# Patient Record
Sex: Female | Born: 1980 | Race: White | Hispanic: No | Marital: Married | Smoking: Former smoker
Health system: Southern US, Community
[De-identification: ages and names within clinical notes are randomized; demographics above are authoritative.]

## PROBLEM LIST (undated history)

## (undated) DIAGNOSIS — R55 Syncope and collapse: Secondary | ICD-10-CM

## (undated) DIAGNOSIS — R519 Headache, unspecified: Secondary | ICD-10-CM

## (undated) DIAGNOSIS — F419 Anxiety disorder, unspecified: Secondary | ICD-10-CM

## (undated) DIAGNOSIS — F329 Major depressive disorder, single episode, unspecified: Secondary | ICD-10-CM

## (undated) DIAGNOSIS — E785 Hyperlipidemia, unspecified: Secondary | ICD-10-CM

## (undated) DIAGNOSIS — R51 Headache: Secondary | ICD-10-CM

## (undated) DIAGNOSIS — K219 Gastro-esophageal reflux disease without esophagitis: Secondary | ICD-10-CM

## (undated) DIAGNOSIS — N809 Endometriosis, unspecified: Secondary | ICD-10-CM

## (undated) DIAGNOSIS — E119 Type 2 diabetes mellitus without complications: Secondary | ICD-10-CM

## (undated) DIAGNOSIS — F32A Depression, unspecified: Secondary | ICD-10-CM

## (undated) DIAGNOSIS — R569 Unspecified convulsions: Secondary | ICD-10-CM

## (undated) DIAGNOSIS — T7840XA Allergy, unspecified, initial encounter: Secondary | ICD-10-CM

## (undated) HISTORY — DX: Headache: R51

## (undated) HISTORY — DX: Endometriosis, unspecified: N80.9

## (undated) HISTORY — DX: Allergy, unspecified, initial encounter: T78.40XA

## (undated) HISTORY — DX: Type 2 diabetes mellitus without complications: E11.9

## (undated) HISTORY — DX: Major depressive disorder, single episode, unspecified: F32.9

## (undated) HISTORY — DX: Anxiety disorder, unspecified: F41.9

## (undated) HISTORY — DX: Gastro-esophageal reflux disease without esophagitis: K21.9

## (undated) HISTORY — PX: ABDOMINAL HYSTERECTOMY: SHX81

## (undated) HISTORY — DX: Depression, unspecified: F32.A

## (undated) HISTORY — DX: Syncope and collapse: R55

## (undated) HISTORY — DX: Headache, unspecified: R51.9

## (undated) HISTORY — PX: EXPLORATORY LAPAROTOMY: SUR591

## (undated) HISTORY — DX: Unspecified convulsions: R56.9

## (undated) HISTORY — DX: Hyperlipidemia, unspecified: E78.5

---

## 2004-10-15 ENCOUNTER — Emergency Department: Payer: Self-pay | Admitting: Internal Medicine

## 2005-01-30 ENCOUNTER — Observation Stay: Payer: Self-pay | Admitting: Unknown Physician Specialty

## 2005-02-11 ENCOUNTER — Ambulatory Visit: Payer: Self-pay | Admitting: Unknown Physician Specialty

## 2005-04-09 ENCOUNTER — Observation Stay: Payer: Self-pay | Admitting: Unknown Physician Specialty

## 2005-04-14 ENCOUNTER — Observation Stay: Payer: Self-pay | Admitting: Unknown Physician Specialty

## 2005-04-21 ENCOUNTER — Inpatient Hospital Stay: Payer: Self-pay | Admitting: Obstetrics & Gynecology

## 2006-02-10 ENCOUNTER — Emergency Department: Payer: Self-pay | Admitting: Emergency Medicine

## 2007-05-20 ENCOUNTER — Observation Stay: Payer: Self-pay

## 2007-06-11 ENCOUNTER — Observation Stay: Payer: Self-pay | Admitting: Unknown Physician Specialty

## 2007-06-23 ENCOUNTER — Inpatient Hospital Stay: Payer: Self-pay | Admitting: Obstetrics and Gynecology

## 2008-02-15 ENCOUNTER — Ambulatory Visit: Payer: Self-pay | Admitting: Obstetrics & Gynecology

## 2008-10-30 ENCOUNTER — Emergency Department: Payer: Self-pay | Admitting: Emergency Medicine

## 2009-01-23 ENCOUNTER — Ambulatory Visit: Payer: Self-pay | Admitting: Cardiovascular Disease

## 2009-02-07 ENCOUNTER — Encounter: Payer: Self-pay | Admitting: Cardiovascular Disease

## 2009-02-07 ENCOUNTER — Ambulatory Visit: Payer: Self-pay

## 2009-02-21 ENCOUNTER — Ambulatory Visit: Payer: Self-pay | Admitting: Cardiovascular Disease

## 2009-12-13 IMAGING — RF DG HYSTEROSALPINOGRAM W/ INJ
1 series · 1 of 1 positions shown · non-contrast
Comparison: none

REASON FOR EXAM: tubal   ligation  status
COMMENTS:

[Series 1: run · 1 of 1 slices shown]
[im 1/1]
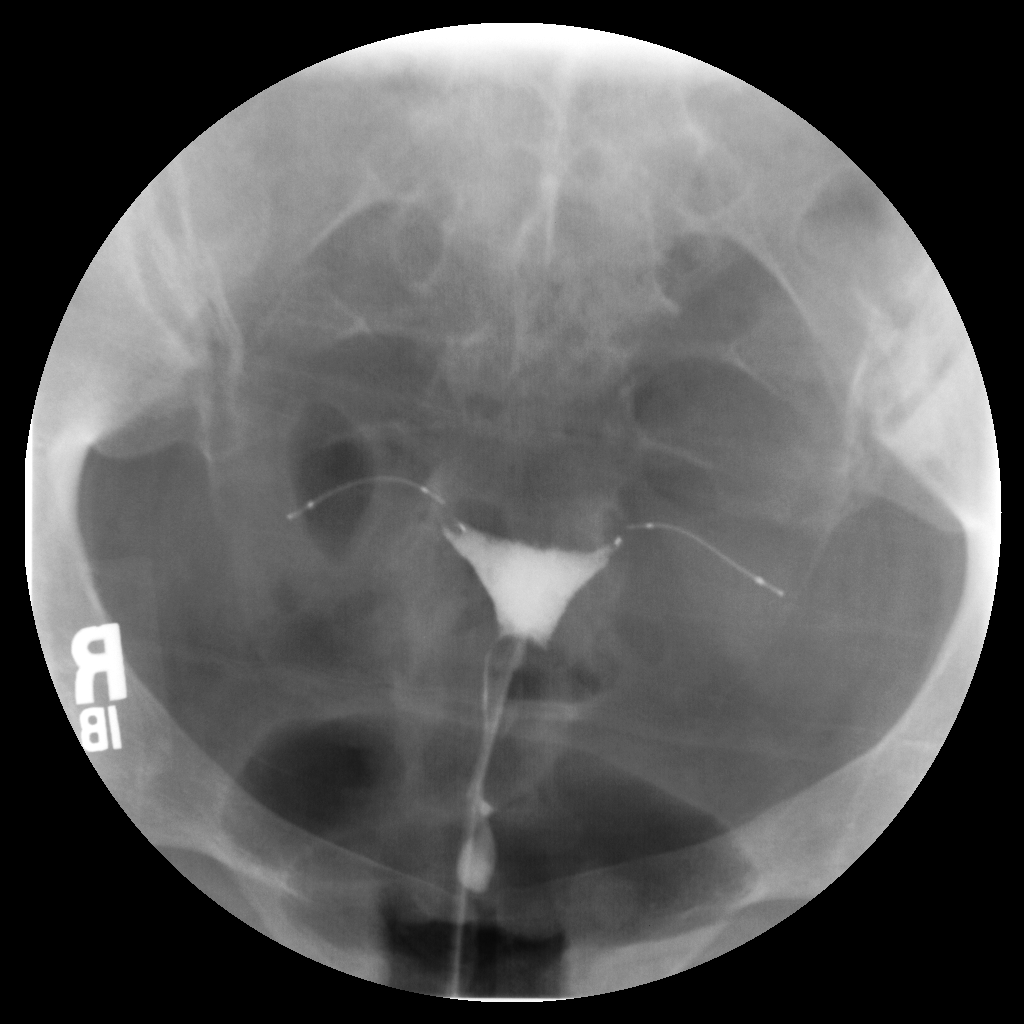

[1 of 1 positions shown; findings below may reference images not displayed]

PROCEDURE:     FL  - FL HYSTEROSALPINGOGRAM  - February 15, 2008  [DATE]

RESULT:     The patient has undergone prior placement of Essure devices in
the fallopian tubes. The anticipated procedure was discussed with the
patient by Dr. Longo and she voiced her willingness to proceed. The cervix
was cannulated by Dr. Longo. There was then injection of Jim by Dr.
Longo. No filling of the fallopian tubes was seen. The Essure devices
appear to be in good radiographic position.
IMPRESSION: I do not see evidence of patency of the fallopian tubes.
The Essure devices appear to be in normal position.

## 2010-06-20 ENCOUNTER — Ambulatory Visit: Payer: Self-pay | Admitting: Obstetrics & Gynecology

## 2010-06-28 ENCOUNTER — Ambulatory Visit: Payer: Self-pay | Admitting: Obstetrics & Gynecology

## 2011-04-02 NOTE — Assessment & Plan Note (Signed)
[facility] OFFICE NOTE   Kristen [name], Kristen [name]                         MRN:          [id number]  DATE:02/21/2009                            DOB:          [birth year]    REASON FOR VISIT:  Followup syncope.   HISTORY OF PRESENT ILLNESS:  Kristen [name] is a 30 year old woman with  recurrent syncope.  She was initially evaluated here on March 8 with 2  syncopal episodes in the previous few months.  She had a benign physical  exam and underwent an echocardiogram that showed normal left ventricular  size and function with no significant valvular heart disease.  Recommendations were made for conservative therapy with liberalizing  salt, pushing fluids, and discontinuing caffeine.  The patient has  actually done well since she has made these changes.  She has had no  further syncopal episodes.  She has had a few episodes of mild  lightheadedness that have occurred on days when she has drank caffeine.  Otherwise, she denies lightheadedness, palpitations, or other  complaints.   MEDICATIONS:  Multivitamin and glucosamine.   ALLERGIES:  PENICILLIN.   PHYSICAL EXAMINATION:  GENERAL:  The patient is alert and oriented.  She  is in no acute distress.  VITAL SIGNS:  Weight is 148 pounds, blood pressure 112/75, heart rate  86, and respiratory rate 16.  HEENT:  Normal.  NECK:  JVP normal.  LUNGS:  Clear bilaterally.  HEART:  Regular rate and rhythm.  No murmurs or gallops.  No clicks when  going from squatting to standing were elicited ABDOMEN:  Soft,  nontender.  EXTREMITIES:  No clubbing, cyanosis, or edema.  Peripheral pulses intact  and equal.  SKIN:  Warm and dry.   ASSESSMENT:  A 30 year old woman with recurrent syncope.  Her symptoms  are greatly improved with conservative measures.  I advised to continue  the same with a focus on eliminating caffeine.  If she has further  problems, I would be happy to see her back in  followup.  If recurrent  syncope occurs, we will consider pharmacotherapy with midodrine or  Florinef.  I would like to avoid this if possible and she seems to have  responded well to lifestyle modification.    Kristen Fells. Excell Seltzer, MD  Electronically Signed   MDC/MedQ  DD: 02/21/2009  DT: 02/22/2009  Job #: [id number]   cc:   Kristen [name]

## 2011-04-02 NOTE — Letter (Signed)
January 23, 2009    Anastasio Champion, MD  [facility]  [address], [address]  [place], [place] [address]   RE:  JANANI, CHAMBER  MRN:  [id number]  /  DOB:  1981-11-01   Dear Dr. Carlynn Purl,   It was my pleasure to see Alley Neils on January 23, 2009, for evaluation  of recurrent syncope.   HISTORY OF PRESENT ILLNESS:  Ms. Marston is a very nice 30 year old woman  with recurrent episodes of syncope.  She reports 2 episodes of frank  syncope in the last 3 months.  First episode occurred in December when  she was caring for her 2 children.  She had been up on her feet for a  while and was getting a sippy cup for her son.  She suddenly felt  flushed and described a hot flash.  She then developed a feeling of  heart racing and chest pain.  She subsequently passed out and twisted  her ankle when she fell to the ground.  She woke up after a few seconds.  She denied bowel or bladder incontinence.  She did not bite her tongue.  She had no other associated symptoms.  A second episode occurred when  she was working out at the jam.  She works out for approximately 2 hours  daily and she was in the early stages of a kick boxing class when she  had a similar experience with a flushed feeling followed by chest pain  and heart racing then passing out with full loss of consciousness.  A  third episode occurred last month also with kick boxing.  This time  after the warning of a flushed feeling came on, she was able to sit down  and the episode abated without loss of consciousness.   The patient actually reports several years of similar symptoms, but they  have been progressive.  She did not have syncope prior to December.  However, she does report several years of episodes of hot flashes and  lightheadedness.  Because of her symptoms, she has slowed down her work  out routine.  She still goes to the gym, but does not work out was  vigorously as she has in the past.  She otherwise  denies exertional  symptoms.  She specifically denies exertional dyspnea or exertional  chest pain.  She denies edema, orthopnea, or PND.  She has had no prior  cardiovascular problems.   HOME MEDICATIONS:  Daily multivitamin and glucosamine.   ALLERGIES:  PENICILLIN.   PAST MEDICAL HISTORY:  No history of hypertension, diabetes,  dyslipidemia, or tobacco use.  No history of any surgeries or  hospitalizations.  The patient has given birth to 2 children, a daughter  and a son, both deliveries were vaginal.   FAMILY HISTORY:  There is no history of coronary artery disease, sudden  death, or syncope.   SOCIAL HISTORY:  The patient is married.  She has a son who is almost 9  years old and a daughter who is almost 59 years old.  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  She is a former smoker but quit cigarettes 10 years  ago.  She has rare alcohol intake.  She drinks caffeine with both sweet  tea and sodas.  She does not use any herbal medications or supplements.   REVIEW OF SYSTEMS:  Complete 12-point review of systems was performed.  There are no pertinent positives to report except as outlined above.  PHYSICAL EXAMINATION:  GENERAL:  The patient is alert and oriented,  healthy-appearing young woman in no acute distress.  VITAL SIGNS:  Weight is 150 pounds, blood pressure 105/70, heart rate is  88, respiratory rate is 12.  HEENT:  Normal.  NECK:  Normal carotid upstrokes.  No bruits.  JVP normal.  No  thyromegaly or thyroid nodules.  LUNGS:  Clear bilaterally.  HEART:  The apex is discrete, nondisplaced.  There is no right  ventricular heave or lift.  Heart has a regular rate and rhythm without  murmurs or gallops.  Dynamic auscultation was performed in squatting and  standing positions with no heart murmurs present.  ABDOMEN:  Soft,  nontender, no bruits.  No organomegaly.  BACK:  No CVA tenderness.  EXTREMITIES:  No clubbing, cyanosis, or edema.  Peripheral pulses are 2+  and  equal throughout.  SKIN:  Warm and dry without rash.  NEUROLOGIC:  Cranial nerves II-XII are intact.  Strength is intact and  equal bilaterally.   EKG shows normal sinus rhythm and is within normal limits.   ASSESSMENT:  This is a 30 year old woman with recurrent syncope.  In a  young, otherwise healthy woman, this is most likely due to a recurrent  neuro depressor event.  She is a typical patient in this population who  runs a low normal blood pressure at her baseline.  I am going to check  an echocardiogram to rule out structural heart disease.  Her physical  exam is consistent with normal cardiac function.  I have advised her to  discontinue caffeine altogether.  She does not seem willing to do this,  but I think it is playing a major role in her symptoms.  I have asked  her to push noncaffeinated fluid such as water and juices.  She has had  a TSH and basic lab work checked already.  If she has recurrent episodes  in spite of the above measures, she will be a candidate for medication,  such as a volume expander like fludrocortisone or an alpha agonists such  as midodrine.  I would like to see her back in followup in 4 weeks to  see how she has progressed.  If she has had any recurrent problems, then  I will likely start her on medication at that point.   Dr. Carlynn Purl, thank you for the opportunity to see this very nice patient.  Please feel free to call at any time with questions regarding her care.    Sincerely,      Veverly Fells. Excell Seltzer, MD  Electronically Signed    MDC/MedQ  DD: 01/23/2009  DT: 01/24/2009  Job #: [id number]

## 2011-05-07 ENCOUNTER — Encounter: Payer: Self-pay | Admitting: Cardiovascular Disease

## 2011-12-30 ENCOUNTER — Emergency Department: Payer: Self-pay | Admitting: Emergency Medicine

## 2011-12-30 LAB — CBC
MCH: 28.2 pg (ref 26.0–34.0)
MCV: 85 fL (ref 80–100)
Platelet: 338 10*3/uL (ref 150–440)
RBC: 4.68 10*6/uL (ref 3.80–5.20)
WBC: 7 10*3/uL (ref 3.6–11.0)

## 2011-12-30 LAB — COMPREHENSIVE METABOLIC PANEL
Alkaline Phosphatase: 62 U/L (ref 50–136)
Anion Gap: 9 (ref 7–16)
Bilirubin,Total: 0.4 mg/dL (ref 0.2–1.0)
Chloride: 103 mmol/L (ref 98–107)
Creatinine: 0.78 mg/dL (ref 0.60–1.30)
EGFR (Non-African Amer.): >60
Osmolality: 279 (ref 275–301)
Potassium: 3.8 mmol/L (ref 3.5–5.1)
SGOT(AST): 15 U/L (ref 15–37)
Sodium: 140 mmol/L (ref 136–145)
Total Protein: 7.3 g/dL (ref 6.4–8.2)

## 2013-08-15 ENCOUNTER — Emergency Department: Payer: Self-pay | Admitting: Emergency Medicine

## 2013-08-15 LAB — CBC
HCT: 41 % (ref 35.0–47.0)
MCH: 28.1 pg (ref 26.0–34.0)
MCHC: 34.6 g/dL (ref 32.0–36.0)
MCV: 81 fL (ref 80–100)
Platelet: 365 10*3/uL (ref 150–440)
RDW: 13.4 % (ref 11.5–14.5)

## 2013-08-15 LAB — DRUG SCREEN, URINE
Amphetamines, Ur Screen: NEGATIVE (ref ?–1000)
Benzodiazepine, Ur Scrn: NEGATIVE (ref ?–200)
Cannabinoid 50 Ng, Ur ~~LOC~~: NEGATIVE (ref ?–50)
Methadone, Ur Screen: NEGATIVE (ref ?–300)
Opiate, Ur Screen: NEGATIVE (ref ?–300)
Tricyclic, Ur Screen: NEGATIVE (ref ?–1000)

## 2013-08-15 LAB — ETHANOL: Ethanol: <3 mg/dL

## 2013-08-15 LAB — COMPREHENSIVE METABOLIC PANEL
Albumin: 4.2 g/dL (ref 3.4–5.0)
Bilirubin,Total: 0.3 mg/dL (ref 0.2–1.0)
Chloride: 108 mmol/L — ABNORMAL HIGH (ref 98–107)
Co2: 22 mmol/L (ref 21–32)
Creatinine: 0.96 mg/dL (ref 0.60–1.30)
EGFR (Non-African Amer.): >60
Glucose: 95 mg/dL (ref 65–99)
SGOT(AST): 22 U/L (ref 15–37)
Total Protein: 7.8 g/dL (ref 6.4–8.2)

## 2013-08-15 LAB — URINALYSIS, COMPLETE
Bacteria: NONE SEEN
Bilirubin,UR: NEGATIVE
Leukocyte Esterase: NEGATIVE
Ph: 7 (ref 4.5–8.0)
RBC,UR: 1 /HPF (ref 0–5)

## 2013-08-15 LAB — ACETAMINOPHEN LEVEL: Acetaminophen: <2 ug/mL

## 2013-08-15 LAB — TSH: Thyroid Stimulating Horm: 1.87 u[IU]/mL

## 2013-08-15 LAB — SALICYLATE LEVEL: Salicylates, Serum: <1.7 mg/dL

## 2013-08-26 ENCOUNTER — Ambulatory Visit (INDEPENDENT_AMBULATORY_CARE_PROVIDER_SITE_OTHER): Payer: No Typology Code available for payment source | Admitting: Psychology

## 2013-08-26 DIAGNOSIS — F329 Major depressive disorder, single episode, unspecified: Secondary | ICD-10-CM

## 2013-09-02 ENCOUNTER — Ambulatory Visit: Payer: No Typology Code available for payment source | Admitting: Psychology

## 2013-09-09 ENCOUNTER — Ambulatory Visit (INDEPENDENT_AMBULATORY_CARE_PROVIDER_SITE_OTHER): Payer: No Typology Code available for payment source | Admitting: Psychology

## 2013-09-09 DIAGNOSIS — F329 Major depressive disorder, single episode, unspecified: Secondary | ICD-10-CM

## 2013-09-30 ENCOUNTER — Ambulatory Visit: Payer: No Typology Code available for payment source | Admitting: Psychology

## 2013-12-31 HISTORY — PX: TOTAL LAPAROSCOPIC HYSTERECTOMY WITH SALPINGECTOMY: SHX6742

## 2014-11-23 ENCOUNTER — Ambulatory Visit (INDEPENDENT_AMBULATORY_CARE_PROVIDER_SITE_OTHER): Payer: 59 | Admitting: Family Medicine

## 2014-11-23 ENCOUNTER — Encounter: Payer: Self-pay | Admitting: Family Medicine

## 2014-11-23 ENCOUNTER — Encounter (INDEPENDENT_AMBULATORY_CARE_PROVIDER_SITE_OTHER): Payer: Self-pay

## 2014-11-23 VITALS — BP 114/70 | HR 80 | Temp 98.0°F | Ht 61.25 in | Wt 169.2 lb

## 2014-11-23 DIAGNOSIS — N3010 Interstitial cystitis (chronic) without hematuria: Secondary | ICD-10-CM

## 2014-11-23 DIAGNOSIS — F419 Anxiety disorder, unspecified: Secondary | ICD-10-CM

## 2014-11-23 DIAGNOSIS — R102 Pelvic and perineal pain: Secondary | ICD-10-CM

## 2014-11-23 DIAGNOSIS — Z915 Personal history of self-harm: Secondary | ICD-10-CM | POA: Insufficient documentation

## 2014-11-23 DIAGNOSIS — Z9151 Personal history of suicidal behavior: Secondary | ICD-10-CM | POA: Insufficient documentation

## 2014-11-23 DIAGNOSIS — F329 Major depressive disorder, single episode, unspecified: Secondary | ICD-10-CM | POA: Insufficient documentation

## 2014-11-23 DIAGNOSIS — Z9189 Other specified personal risk factors, not elsewhere classified: Secondary | ICD-10-CM

## 2014-11-23 DIAGNOSIS — F418 Other specified anxiety disorders: Secondary | ICD-10-CM

## 2014-11-23 DIAGNOSIS — N943 Premenstrual tension syndrome: Secondary | ICD-10-CM

## 2014-11-23 MED ORDER — AMITRIPTYLINE HCL 10 MG PO TABS
10.0000 mg | ORAL_TABLET | Freq: Every day | ORAL | Status: DC
Start: 2014-11-23 — End: 2014-12-18

## 2014-11-23 NOTE — Assessment & Plan Note (Signed)
Symptoms overall resolved but now having PMS.  See below. I am leary given h/o suicide attempt but she feels she is in a good place now and that was exacerbated by her pelvic pain She is also now aware that she could never do that to her family. We are starting amitriptyline for IC and she is contracted for safety.  We did discuss psychiatry for further med management which she is leary about.  She will think about it as this would be outside my scope of practice at this point (beyond starting TCA today). The patient indicates understanding of these issues and agrees with the plan.

## 2014-11-23 NOTE — Progress Notes (Signed)
Subjective:   Patient ID: Kristen [name], female    DOB: 1981/07/20, 34 y.o.   MRN: [id number]  Kristen [name] is a pleasant 34 y.o. year old female who presents to clinic today with Establish Care and Anxiety  on 11/23/2014  HPI:  I see her husband who asked me to work her in as a new patient urgently due to worsening anxiety.  Has had a difficult past several years,.  In December 2008, Essure placed for birth control after birth of her second child. Almost immediately after that, developed chronic pelvic pain.  Went through multiple procedures including exp lap, per pt, to rule out endometriosis, all unremarkable. Referred to urology- ? urodynamics (awaiting records) consistent with IC.  She was given an rx but cannot remember what it was and if it helped.  Referred herself to pelvic pain clinic at [facility]- fibroids discovered and she had a hysterectomy (ovaries remain intact) in 2/ 2014.  Some symptoms have improved but still has urinary frequency, bladder pressure and intermittent pelvic pain.  Prior to her hysterectomy, she became very depressed and attempted suicide with rx overdose.  Per pt, she realized she would hurt her husband and children too much and would never try this again.  Also has not been depressed since her hysterectomy but does have "PMS."  Once a month when she normally did previously menstruate,s he is having more anxiety and mood swings.  Sleeping ok.  Appetite good.  She has been on an antidepressant in past but she does not remember which one it was.  Was seeing a therapist, cannot remember her name.   No current outpatient prescriptions on file prior to visit.   No current facility-administered medications on file prior to visit.    Allergies  Allergen Reactions  . Penicillins Rash    Past Medical History  Diagnosis Date  . Syncope   . Depression   . GERD (gastroesophageal reflux disease)   . Frequent headaches   . Fainting spell   . Endometriosis      Past Surgical History  Procedure Laterality Date  . Abdominal hysterectomy    . Exploratory laparotomy      Family History  Problem Relation Age of Onset  . Hyperlipidemia Mother   . Hypertension Mother   . Diabetes Mother   . Cancer Maternal Grandmother   . Mental illness Maternal Grandmother     History   Social History  . Marital Status: Single    Spouse Name: N/A    Number of Children: N/A  . Years of Education: N/A   Occupational History  . Not on file.   Social History Main Topics  . Smoking status: Former Research scientist (life sciences)  . Smokeless tobacco: Never Used     Comment: quit 10 yrs ago   . Alcohol Use: Yes     Comment: seldom drinks   . Drug Use: No  . Sexual Activity: Yes   Other Topics Concern  . Not on file   Social History Narrative   Married, daughter is 3 1/2, son is 36 months. 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     The PMH, PSH, Social History, Family History, Medications, and allergies have been reviewed in [facility], and have been updated if relevant.  Review of Systems  Constitutional: Negative.   HENT: Negative.   Eyes: Negative.   Respiratory: Negative.   Cardiovascular: Negative.   Gastrointestinal: Negative.   Genitourinary: Positive for dysuria, urgency, frequency, pelvic  pain and dyspareunia. Negative for hematuria, flank pain, decreased urine volume, vaginal bleeding, vaginal discharge, enuresis, difficulty urinating, genital sores, vaginal pain and menstrual problem.  Musculoskeletal: Negative.   Skin: Negative.   Neurological: Negative.   Psychiatric/Behavioral: Positive for dysphoric mood. Negative for suicidal ideas, hallucinations, behavioral problems, confusion, sleep disturbance, self-injury, decreased concentration and agitation. The patient is nervous/anxious. The patient is not hyperactive.        Objective:    BP 114/70 mmHg  Pulse 80  Temp(Src) 98 F (36.7 C) (Oral)  Ht 5' 1.25" (1.556 m)  Wt  169 lb 4 oz (76.771 kg)  BMI 31.71 kg/m2  SpO2 95%   Physical Exam  Constitutional: She is oriented to person, place, and time. She appears well-developed and well-nourished. No distress.  HENT:  Head: Normocephalic.  Eyes: Conjunctivae are normal.  Neck: Normal range of motion.  Cardiovascular: Normal rate.   Pulmonary/Chest: Effort normal. No respiratory distress.  Musculoskeletal: Normal range of motion.  Neurological: She is alert and oriented to person, place, and time. No cranial nerve deficit.  Skin: Skin is warm and dry.  Psychiatric: She has a normal mood and affect. Her behavior is normal. Judgment and thought content normal.  Nursing note and vitals reviewed.         Assessment & Plan:   Interstitial cystitis  PMS (premenstrual syndrome)  Pelvic pain in female  History of suicide attempt  Anxiety and depression No Follow-up on file.

## 2014-11-23 NOTE — Progress Notes (Signed)
Pre visit review using our clinic review tool, if applicable. No additional management support is needed unless otherwise documented below in the visit note. 

## 2014-11-23 NOTE — Patient Instructions (Addendum)

## 2014-11-23 NOTE — Assessment & Plan Note (Signed)
>  45 minutes spent in face to face time with patient, >50% spent in counselling or coordination of care concerning her IC, depression, PMS. Explained to Kristen [name] that IC is often very difficult to treat but she was unaware of treatment options, including diet. Discussed in detail and given handout. Will also start amitriptyline 50 mg qhs- may also help with depression. Follow up in 1 month. See below.

## 2014-12-18 ENCOUNTER — Other Ambulatory Visit: Payer: Self-pay | Admitting: Family Medicine

## 2014-12-19 MED ORDER — AMITRIPTYLINE HCL 10 MG PO TABS: 10.0000 mg | ORAL_TABLET | Freq: Every day | ORAL | Status: DC

## 2014-12-28 ENCOUNTER — Encounter: Payer: Self-pay | Admitting: Family Medicine

## 2014-12-28 ENCOUNTER — Ambulatory Visit (INDEPENDENT_AMBULATORY_CARE_PROVIDER_SITE_OTHER): Payer: 59 | Admitting: Family Medicine

## 2014-12-28 VITALS — BP 118/70 | HR 80 | Temp 98.0°F | Wt 172.5 lb

## 2014-12-28 DIAGNOSIS — F418 Other specified anxiety disorders: Secondary | ICD-10-CM

## 2014-12-28 DIAGNOSIS — F32A Depression, unspecified: Secondary | ICD-10-CM

## 2014-12-28 DIAGNOSIS — N3010 Interstitial cystitis (chronic) without hematuria: Secondary | ICD-10-CM

## 2014-12-28 DIAGNOSIS — Z9189 Other specified personal risk factors, not elsewhere classified: Secondary | ICD-10-CM

## 2014-12-28 DIAGNOSIS — Z915 Personal history of self-harm: Secondary | ICD-10-CM

## 2014-12-28 DIAGNOSIS — F329 Major depressive disorder, single episode, unspecified: Secondary | ICD-10-CM

## 2014-12-28 DIAGNOSIS — F419 Anxiety disorder, unspecified: Secondary | ICD-10-CM

## 2014-12-28 DIAGNOSIS — Z9151 Personal history of suicidal behavior: Secondary | ICD-10-CM

## 2014-12-28 NOTE — Progress Notes (Signed)
Pre visit review using our clinic review tool, if applicable. No additional management support is needed unless otherwise documented below in the visit note. 

## 2014-12-28 NOTE — Assessment & Plan Note (Signed)
Symptoms improved with low dose Elavil. She will like to continue this despite side effects. Will continue for now and she will update me.

## 2014-12-28 NOTE — Progress Notes (Signed)
Subjective:   Patient ID: Kristen [name], female    DOB: [birth year], 34 y.o.   MRN: [id number]  Kristen [name] is a pleasant 34 y.o. year old female who presents to clinic today with Follow-up  on 12/28/2014  HPI:  Established care with me last month when we discussed her difficult year:  Has had a difficult past several years,.  In December 2008, Essure placed for birth control after birth of her second child. Almost immediately after that, developed chronic pelvic pain.  Went through multiple procedures including exp lap, per pt, to rule out endometriosis, all unremarkable. Referred to urology- ? urodynamics (awaiting records) consistent with IC.  She was given an rx but cannot remember what it was and if it helped.  Referred herself to pelvic pain clinic at [facility]- fibroids discovered and she had a hysterectomy (ovaries remain intact) in 2/ 2014.  Some symptoms have improved but still has urinary frequency, bladder pressure and intermittent pelvic pain.  Prior to her hysterectomy, she became very depressed and attempted suicide with rx overdose.  Per pt, she realized she would hurt her husband and children too much and would never try this again.  Also has not been depressed since her hysterectomy but does have "PMS."  Once a month when she normally did previously menstruate,s he is having more anxiety and mood swings.  Started Elavil 10 mg nightly.  She feels it has improved her mood and urinary frequency but still feeling sleepy for several hours the next day.   Current Outpatient Prescriptions on File Prior to Visit  Medication Sig Dispense Refill  . amitriptyline (ELAVIL) 10 MG tablet Take 1 tablet (10 mg total) by mouth at bedtime. 30 tablet 0   No current facility-administered medications on file prior to visit.    Allergies  Allergen Reactions  . Penicillins Rash    Past Medical History  Diagnosis Date  . Syncope   . Depression   . GERD (gastroesophageal reflux  disease)   . Frequent headaches   . Fainting spell   . Endometriosis     Past Surgical History  Procedure Laterality Date  . Abdominal hysterectomy    . Exploratory laparotomy      Family History  Problem Relation Age of Onset  . Hyperlipidemia Mother   . Hypertension Mother   . Diabetes Mother   . Cancer Maternal Grandmother   . Mental illness Maternal Grandmother     History   Social History  . Marital Status: Single    Spouse Name: N/A  . Number of Children: N/A  . Years of Education: N/A   Occupational History  . Not on file.   Social History Main Topics  . Smoking status: Former Research scientist (life sciences)  . Smokeless tobacco: Never Used     Comment: quit 10 yrs ago   . Alcohol Use: Yes     Comment: seldom drinks   . Drug Use: No  . Sexual Activity: Yes   Other Topics Concern  . Not on file   Social History Narrative   Married, daughter is 3 1/2, son is 49 months. 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     The PMH, PSH, Social History, Family History, Medications, and allergies have been reviewed in [facility], and have been updated if relevant.  Review of Systems  Constitutional: Negative.   HENT: Negative.   Eyes: Negative.   Respiratory: Negative.   Cardiovascular: Negative.   Gastrointestinal: Negative.   Genitourinary:  Positive for urgency. Negative for dysuria, frequency, hematuria, flank pain, decreased urine volume, vaginal bleeding, vaginal discharge, enuresis, difficulty urinating, genital sores, vaginal pain, menstrual problem, pelvic pain and dyspareunia.  Musculoskeletal: Negative.   Skin: Negative.   Neurological: Negative.   Psychiatric/Behavioral: Negative for suicidal ideas, hallucinations, behavioral problems, confusion, sleep disturbance, self-injury, dysphoric mood, decreased concentration and agitation. The patient is nervous/anxious. The patient is not hyperactive.        Objective:    BP 118/70 mmHg  Pulse 80   Temp(Src) 98 F (36.7 C) (Oral)  Wt 172 lb 8 oz (78.245 kg)  SpO2 97%   Physical Exam  Constitutional: She is oriented to person, place, and time. She appears well-developed and well-nourished. No distress.  HENT:  Head: Normocephalic.  Eyes: Conjunctivae are normal.  Neck: Normal range of motion.  Cardiovascular: Normal rate.   Pulmonary/Chest: Effort normal. No respiratory distress.  Musculoskeletal: Normal range of motion.  Neurological: She is alert and oriented to person, place, and time. No cranial nerve deficit.  Skin: Skin is warm and dry.  Psychiatric: She has a normal mood and affect. Her behavior is normal. Judgment and thought content normal.  Nursing note and vitals reviewed.         Assessment & Plan:   Interstitial cystitis  Anxiety and depression  History of suicide attempt No Follow-up on file.

## 2014-12-28 NOTE — Patient Instructions (Signed)
Good to see you. Please call me in 1 month with an update.

## 2015-01-02 ENCOUNTER — Encounter: Payer: Self-pay | Admitting: Family Medicine

## 2015-01-16 ENCOUNTER — Ambulatory Visit (INDEPENDENT_AMBULATORY_CARE_PROVIDER_SITE_OTHER): Payer: 59 | Admitting: Family Medicine

## 2015-01-16 ENCOUNTER — Ambulatory Visit (INDEPENDENT_AMBULATORY_CARE_PROVIDER_SITE_OTHER)
Admission: RE | Admit: 2015-01-16 | Discharge: 2015-01-16 | Disposition: A | Discharge status: Home / Self Care | Payer: 59 | Source: Ambulatory Visit | Attending: Family Medicine | Admitting: Family Medicine

## 2015-01-16 ENCOUNTER — Telehealth: Payer: Self-pay | Admitting: Family Medicine

## 2015-01-16 ENCOUNTER — Encounter: Payer: Self-pay | Admitting: Family Medicine

## 2015-01-16 VITALS — BP 122/82 | HR 88 | Temp 99.0°F | Wt 177.8 lb

## 2015-01-16 DIAGNOSIS — W19XXXA Unspecified fall, initial encounter: Secondary | ICD-10-CM | POA: Insufficient documentation

## 2015-01-16 DIAGNOSIS — S0993XA Unspecified injury of face, initial encounter: Secondary | ICD-10-CM

## 2015-01-16 MED ORDER — TRAMADOL HCL 50 MG PO TABS: 50.0000 mg | ORAL_TABLET | Freq: Two times a day (BID) | ORAL | Status: DC | PRN

## 2015-01-16 NOTE — Telephone Encounter (Signed)
Happy Patient Name: Kristen [name] DOB: [birth year] Initial Comment Caller states she tripped and fell yesterday and hit her head on a stepping stone she has a knot on her head and her head is hurting really bad Nurse Assessment Nurse: Rock Nephew, RN, Juliann Pulse Date/Time (Eastern Time): 01/16/2015 10:45:30 AM Confirm and document reason for call. If symptomatic, describe symptoms. ---Caller states she tripped and fell yesterday and hit her head on a stepping stone, she has a knot on her head and her head is hurting really bad . She has a scheduled appointment today ( this afternoon ) Has the patient traveled out of the country within the last 30 days? ---Not Applicable Does the patient require triage? ---Yes Related visit to physician within the last 2 weeks? ---No Does the PT have any chronic conditions? (i.e. diabetes, asthma, etc.) ---Yes List chronic conditions. ---IC Did the patient indicate they were pregnant? ---No Guidelines Guideline Title Affirmed Question Affirmed Notes Head Injury Scalp swelling, bruise or pain (all triage questions negative) Final Disposition User Bowles, RN, Juliann Pulse

## 2015-01-16 NOTE — Telephone Encounter (Signed)
Has appt with Dr. Darnell Level today.

## 2015-01-16 NOTE — Progress Notes (Signed)
   BP 122/82 mmHg  Pulse 88  Temp(Src) 99 F (37.2 C) (Oral)  Wt 177 lb 12 oz (80.627 kg)   CC: fall  Subjective:    Patient ID: Kristen [name], female    DOB: 03/13/1981, 34 y.o.   MRN: [id number]  HPI: Kristen [name] is a 34 y.o. female presenting on 01/16/2015 for Fall   Returned from vacation yesterday, around 3pm while unloading car, tripped over cinder block and hit face and left shin (hands were full). Has been treating with ice pack on face and shin, treating abrasions with triple abx ointment.   Yesterday frontal headache along with R occipital pain. Today just with persistent frontal headache. Denies nausea/vomiting, vision changes, confusion, somnolence, no LOC.  No premonitory sxs.   Went to work this morning (works from home).   Taking ibuprofen 400mg  and excedrin 2 tablets for pain. Alternating dosing.   Relevant past medical, surgical, family and social history reviewed and updated as indicated. Interim medical history since our last visit reviewed. Allergies and medications reviewed and updated. No current outpatient prescriptions on file prior to visit.   No current facility-administered medications on file prior to visit.    Review of Systems Per HPI unless specifically indicated above     Objective:    BP 122/82 mmHg  Pulse 88  Temp(Src) 99 F (37.2 C) (Oral)  Wt 177 lb 12 oz (80.627 kg)  Wt Readings from Last 3 Encounters:  01/16/15 177 lb 12 oz (80.627 kg)  12/28/14 172 lb 8 oz (78.245 kg)  11/23/14 169 lb 4 oz (76.771 kg)    Physical Exam  Constitutional: She appears well-developed and well-nourished. No distress.  HENT:  Head: Head is with abrasion. Head is without raccoon's eyes and without Battle's sign.    Abrasions L forehead and down bridge of nostril to left ala. TMs clear, no nasal septal hematoma appreciated, oropharynx clear with MMM  Musculoskeletal: She exhibits no edema.       Legs: Shallow abrasion of L upper shin No pain at  fibula. Tender to palpation anterior superior tibia No pain with ankle testing.  Skin: Skin is warm and dry. Abrasion noted.  Nursing note and vitals reviewed.  No results found for this or any previous visit.    Assessment & Plan:   Problem List Items Addressed This Visit    Fall - Primary    Mechanical fall yesterday afternoon, injured face and L leg. Xray of L tibia today to r/o fracture today but anticipate bony contusion. Discussed treatment plan - see below.      Relevant Orders   DG Tibia/Fibula Left   Facial injury    Facial abrasions after fall onto stepping stone No evidence of facial fracture today, doubt concussion after fall rather just anticipated soreness after facial abrasions.  Supportive care with ice pack, abx ointment to dress wounds and ibuprofen /tramadol course for pain. Pt agrees with plan. Red flags to return or seek urgent care discussed.          Follow up plan: Return if symptoms worsen or fail to improve.

## 2015-01-16 NOTE — Assessment & Plan Note (Signed)
Facial abrasions after fall onto stepping stone No evidence of facial fracture today, doubt concussion after fall rather just anticipated soreness after facial abrasions.  Supportive care with ice pack, abx ointment to dress wounds and ibuprofen /tramadol course for pain. Pt agrees with plan. Red flags to return or seek urgent care discussed.

## 2015-01-16 NOTE — Patient Instructions (Addendum)
I'm sorry you fell! Continue keeping facial abrasions clean and dressed with antibiotic ointment. Continue ice pack to face and to L leg. Xray of L leg today. Take ibuprofen 600mg  three times daily with food for 5 days then as needed. May use tramadol for breakthrough pain. If worsening headache, any confusion or nausea/vomiting, please return or seek care.

## 2015-01-16 NOTE — Assessment & Plan Note (Signed)
Mechanical fall yesterday afternoon, injured face and L leg. Xray of L tibia today to r/o fracture today but anticipate bony contusion. Discussed treatment plan - see below.

## 2015-01-16 NOTE — Progress Notes (Signed)
Pre visit review using our clinic review tool, if applicable. No additional management support is needed unless otherwise documented below in the visit note. 

## 2015-01-17 ENCOUNTER — Encounter: Payer: Self-pay | Admitting: Family Medicine

## 2015-01-21 ENCOUNTER — Emergency Department: Payer: Self-pay | Admitting: Emergency Medicine

## 2015-01-23 ENCOUNTER — Encounter: Payer: Self-pay | Admitting: Family Medicine

## 2015-01-23 ENCOUNTER — Telehealth: Payer: Self-pay

## 2015-01-23 DIAGNOSIS — R55 Syncope and collapse: Secondary | ICD-10-CM

## 2015-01-23 DIAGNOSIS — S0993XD Unspecified injury of face, subsequent encounter: Secondary | ICD-10-CM

## 2015-01-23 DIAGNOSIS — W19XXXD Unspecified fall, subsequent encounter: Secondary | ICD-10-CM

## 2015-01-23 NOTE — Telephone Encounter (Signed)
plz get records from [facility] ER.

## 2015-01-23 NOTE — Telephone Encounter (Signed)
PLEASE NOTE: All timestamps contained within this report are represented as [place] Standard Time. CONFIDENTIALTY NOTICE: This fax transmission is intended only for the addressee. It contains information that is legally privileged, confidential or otherwise protected from use or disclosure. If you are not the intended recipient, you are strictly prohibited from reviewing, disclosing, copying using or disseminating any of this information or taking any action in reliance on or regarding this information. If you have received this fax in error, please notify us immediately by telephone so that we can arrange for its return to [place]. Phone: [phone], Toll-Free: [phone], Fax: [phone] Page: 1 of 2 Call Id: [id number] [place] Patient Name: Kristen [name] Gender: Female DOB: [birth year] Age: 34 Y 1 M 13 D Return Phone Number: [phone] (Primary) Address: City/State/Zip: [place] [address] Client [facility] Night - Client Client Site [place] Physician Deborra Medina, [place] Type Call Call Type Triage / Clinical Relationship To Patient Self Return Phone Number [phone] (Primary) Chief Complaint Vomiting Initial Comment Caller states she is vomiting PreDisposition Did not know what to do Nurse Assessment Nurse: Loletta Specter, RN, Wells Guiles Date/Time (Eastern Time): 01/21/2015 9:59:24 AM Confirm and document reason for call. If symptomatic, describe symptoms. ---Caller states she is vomiting no diarrhea. Caller states she hit her head last sunday and has had headache since then, passed out today. Has the patient traveled out of the country within the last 30 days? ---Not Applicable Does the patient require triage? ---Yes Related visit to physician within the last 2 weeks? ---Yes Does the PT have any chronic conditions? (i.e.  diabetes, asthma, etc.) ---No Did the patient indicate they were pregnant? ---No Guidelines Guideline Title Affirmed Question Affirmed Notes Nurse Date/Time (Eastern Time) Head Injury [1] ACUTE NEURO SYMPTOM AND [2] now fine (DEFINITION: difficult to awaken OR confused thinking and talking OR slurred speech OR weakness of arms OR unsteady walking) Loletta Specter, RN, Wells Guiles 01/21/2015 10:00:17 AM Disp. Time Eilene Ghazi Time) Disposition Final User 01/21/2015 10:01:40 AM Go to ED Now (or PCP triage) Yes Loletta Specter, RN, Romualdo Bolk Understands: Yes Disagree/Comply: Comply PLEASE NOTE: All timestamps contained within this report are represented as [place] Standard Time. CONFIDENTIALTY NOTICE: This fax transmission is intended only for the addressee. It contains information that is legally privileged, confidential or otherwise protected from use or disclosure. If you are not the intended recipient, you are strictly prohibited from reviewing, disclosing, copying using or disseminating any of this information or taking any action in reliance on or regarding this information. If you have received this fax in error, please notify us immediately by telephone so that we can arrange for its return to [place]. Phone: [phone], Toll-Free: [phone], Fax: [phone] Page: 2 of 2 Call Id: [id number] Care Advice Given Per Guideline GO TO ED NOW (OR PCP TRIAGE): * IF NO PCP TRIAGE: You need to be seen. Go to the [facility] at _____________ Hospital within the next hour. Leave as soon as you can. CARE ADVICE given per Head Injury (Adult) guideline. After Care Instructions Given Call Event Type User Date / Time Description

## 2015-01-23 NOTE — Telephone Encounter (Signed)
Left a voicemail for a call back

## 2015-01-23 NOTE — Telephone Encounter (Signed)
Advised go to ER with headache and vomiting after fall. plz call today for update.

## 2015-01-24 NOTE — Telephone Encounter (Signed)
I have not seen her for this. Will route to Dr. Darnell Level

## 2015-01-24 NOTE — Telephone Encounter (Signed)
Records from [facility] ER placed in Dr Gutierrez's inbox

## 2015-01-24 NOTE — Telephone Encounter (Signed)
Records placed in Dr Gutierrez's inbox.

## 2015-01-24 NOTE — Telephone Encounter (Signed)
Pt retuned chan called Pt stated she wants a referral to  neurology

## 2015-01-24 NOTE — Telephone Encounter (Signed)
Left voice mail for a call back

## 2015-01-25 NOTE — Telephone Encounter (Signed)
Reviewed ER records - seen for passing out episode with vomiting after prior head injury. But unclear if related. Had normal blood work (WBC 8.5, Hgb 13.8, plt 330, TnI <0.02, Cr 0.8, lytes normal, glu 98, LFTs WNL and normal UA), normal EKG and CT scan. Actually referred to neurology by ER. Referral placed today.  plz send ER records (in my in box) and my last note to neuro, then scan ER records into chart. To PCP as fyi.

## 2015-01-26 NOTE — Telephone Encounter (Signed)
Called the patient and no answer. [facility] to schedule an appt and the patient had already called them and they saw her yesterday. I faxed your office notes and hospital records.Records were sent for scanning. Sent the patient a mychart message acknowledging that we knew that she was seen by Dr Melrose Nakayama. Their office was also sending [place] their note.

## 2015-03-10 NOTE — Consult Note (Signed)
Brief Consult Note: Diagnosis: Adjustmernt disorder with depressed mood.   Patient was seen by consultant.   Consult note dictated.   Recommend further assessment or treatment.   Comments: Kristen [name] has a h/o depression. She has been of medications for a month feeling good. She had an argument with her husband and overdosed impulsively on Wellbutrin.   She is not suicidal or homicidal. She is a loving mothet and wife. She is able to contract for safety.   PLAN: 1. The patient no longer meets criteria for IVC. I will tgerminate proceedings. Please discharge as appropriate.   2. No medications recommended. The patient is not interested in pharmacotherapy.   3. Her self and her husband want to start couples therapy. She has Multimedia programmer.  Electronic Signatures: Orson Slick (MD)  (Signed 29-Sep-14 13:50)  Authored: Brief Consult Note   Last Updated: 29-Sep-14 13:50 by Orson Slick (MD)

## 2015-03-10 NOTE — Consult Note (Signed)
PATIENT NAME:  Kristen [name], Kristen [name] MR#:  [id number] DATE OF BIRTH:  [birth year]  DATE OF CONSULTATION:  08/16/2013  REFERRING PHYSICIAN:  Briant Sites. Joni Fears, MD CONSULTING PHYSICIAN:  Kearra Calkin B. Amand Lemoine, MD  REASON FOR CONSULTATION: To evaluate a suicidal patient.   IDENTIFYING DATA: Kristen [name] is a 34 year old female with history of depression.   CHIEF COMPLAINT: "I'm so sorry."   HISTORY OF PRESENT ILLNESS: Kristen [name] has a history of mild depression. She has been maintained on Wellbutrin by her gynecologist. She reports good response to Wellbutrin to the point that they discontinued Wellbutrin a month or so ago. The patient felt no need to take it more. In addition, when at some point her Wellbutrin was switched from extended-release to intermediate-disease, she felt that the medication made her groggy, unenergetic, like in a fog and she did not like taking it anymore. She reports that she has been doing well except that on the day of admission her husband took the patient and the kids that are ages 52 and 65, to the beach. The patient did not like the idea as they really could not afford the trip so she had been arguing with her husband all along. As a result, she took a handful of tablets of Wellbutrin and overdosed. She told her husband and was brought to the hospital. She was kept in the Emergency Room for 24 hours for medical clearance. She is ashamed of he action. She has been thinking of her children and her husband for the past 24 hours and cannot wait to see them again. She denies any symptoms of depression, anxiety or psychosis. She points out that she overdosed on impulse and denies ever having thoughts of suicide before. She is asking to be discharged to home. She denies alcohol, illicit substance or prescription pill abuse.   PAST PSYCHIATRIC HISTORY: None. She has never seen a psychiatrist, has never been hospitalized. No substance abuse problems Medication had been prescribed by her OB/GYN.    FAMILY PSYCHIATRIC HISTORY:  None reported.   PAST MEDICAL HISTORY: Interstitial cystitis.   ALLERGIES: PENICILLIN.   MEDICATIONS ON ADMISSION: Tramadol and multivitamins.   SOCIAL HISTORY: She is married. She has 2 children. She has been employed from home, using a telephone. She works very few hours. Between her interstitial cystitis symptoms and taking care of the kids, she has not been able to work much. This causes financial problems.    REVIEW OF SYSTEMS CONSTITUTIONAL: No fevers or chills. No weight changes.  EYES: No double or blurred vision.  ENT: No hearing loss.  RESPIRATORY: No shortness of breath or cough.  CARDIOVASCULAR: No chest pain or orthopnea.  GASTROINTESTINAL: No abdominal pain, nausea, vomiting or diarrhea.  GENITOURINARY: No incontinence or frequency.  ENDOCRINE: No heat or cold intolerance.  LYMPHATIC: No anemia or easy bruising.  INTEGUMENTARY: No acne or rash.  MUSCULOSKELETAL: No muscle or joint pain.  NEUROLOGIC: No tingling or weakness.  PSYCHIATRIC: See history of present illness for details.   PHYSICAL EXAMINATION VITAL SIGNS: Blood pressure 101/67, pulse 98, respirations 18, temperature not done.  GENERAL:  This is a well-developed female in no acute distress. The rest of the physical examination is deferred to her primary attending.   LABORATORY DATA: Chemistries are within normal limits except for potassium of 3.3. LFTs within normal limits. TSH 1.87. Urine tox screen positive for MDMA. CBC within normal limits. Urinalysis is not suggestive of urinary tract infection. Serum acetaminophen and salicylates are low. EKG sinus  tachycardia with occasional premature ventricular complexes. Urine pregnancy test is negative.   MENTAL STATUS EXAMINATION: The patient is alert and oriented to person, place, time and situation. She is pleasant, polite and cooperative. She is well groomed. She maintains good eye contact. She gets emotional when talking about her  children and her husband. Her speech is of normal rhythm, rate and volume. Her mood is fine with slightly tearful affect. Thought process is logical and goal oriented. Thought content: She denies suicidal or homicidal ideation. There are no delusions or paranoia. There are no auditory or visual hallucinations. Her cognition is grossly intact. She registers 3 out of 3 and recalls 3 out of 3 objects after 5 minutes. She can spell "world" forward and backward. She knows the current president. Her insight and judgment are questionable.   DIAGNOSES AXIS I: Adjustment disorder with depressed mood.  AXIS II: Deferred.  AXIS III: Interstitial cystitis.  AXIS IV: Mental and physical illness, financial, marital.  AXIS V: GAF 55.   PLAN 1.  The patient no longer meets criteria for involuntary inpatient psychiatric commitment. I will terminate proceedings. Please discharge as appropriate.  2.  No medications recommended. The patient is not interested in pharmacotherapy.   3.  She is, however, interested in psychotherapy and especially in marital counseling.  She has Multimedia programmer.   ____________________________ Wardell Honour. Bary Leriche, MD jbp:cs D: 08/16/2013 16:57:25 ET T: 08/16/2013 18:14:34 ET JOB#: [id number]  cc: Markees Carns B. Bary Leriche, MD, <Dictator> Clovis Fredrickson MD ELECTRONICALLY SIGNED 08/26/2013 7:42

## 2015-05-19 ENCOUNTER — Encounter: Payer: Self-pay | Admitting: Family Medicine

## 2016-01-31 ENCOUNTER — Other Ambulatory Visit: Payer: Self-pay | Admitting: Internal Medicine

## 2016-01-31 DIAGNOSIS — Z0000 Encounter for general adult medical examination without abnormal findings: Secondary | ICD-10-CM

## 2016-02-08 ENCOUNTER — Other Ambulatory Visit (INDEPENDENT_AMBULATORY_CARE_PROVIDER_SITE_OTHER): Payer: BLUE CROSS/BLUE SHIELD

## 2016-02-08 DIAGNOSIS — Z0000 Encounter for general adult medical examination without abnormal findings: Secondary | ICD-10-CM | POA: Diagnosis not present

## 2016-02-08 LAB — CBC WITH DIFFERENTIAL/PLATELET
Basophils Absolute: 0.1 10*3/uL (ref 0.0–0.1)
Basophils Relative: 0.7 % (ref 0.0–3.0)
EOS PCT: 1.6 % (ref 0.0–5.0)
Eosinophils Absolute: 0.1 10*3/uL (ref 0.0–0.7)
HCT: 42.3 % (ref 36.0–46.0)
HEMOGLOBIN: 14.1 g/dL (ref 12.0–15.0)
LYMPHS PCT: 31.1 % (ref 12.0–46.0)
Lymphs Abs: 2.4 10*3/uL (ref 0.7–4.0)
MCHC: 33.3 g/dL (ref 30.0–36.0)
MCV: 84 fl (ref 78.0–100.0)
MONOS PCT: 6.2 % (ref 3.0–12.0)
Monocytes Absolute: 0.5 10*3/uL (ref 0.1–1.0)
Neutro Abs: 4.6 10*3/uL (ref 1.4–7.7)
Neutrophils Relative %: 60.4 % (ref 43.0–77.0)
Platelets: 386 10*3/uL (ref 150.0–400.0)
RBC: 5.03 Mil/uL (ref 3.87–5.11)
RDW: 13.6 % (ref 11.5–15.5)
WBC: 7.6 10*3/uL (ref 4.0–10.5)

## 2016-02-08 LAB — LIPID PANEL
CHOLESTEROL: 197 mg/dL (ref 0–200)
HDL: 43.2 mg/dL (ref >39.00)
LDL CALC: 117 mg/dL — AB (ref 0–99)
NonHDL: 153.76
Total CHOL/HDL Ratio: 5
Triglycerides: 185 mg/dL — ABNORMAL HIGH (ref 0.0–149.0)
VLDL: 37 mg/dL (ref 0.0–40.0)

## 2016-02-08 LAB — COMPREHENSIVE METABOLIC PANEL
ALBUMIN: 4.5 g/dL (ref 3.5–5.2)
ALT: 22 U/L (ref 0–35)
AST: 14 U/L (ref 0–37)
Alkaline Phosphatase: 76 U/L (ref 39–117)
BILIRUBIN TOTAL: 0.4 mg/dL (ref 0.2–1.2)
BUN: 10 mg/dL (ref 6–23)
CALCIUM: 9.4 mg/dL (ref 8.4–10.5)
CO2: 30 mEq/L (ref 19–32)
CREATININE: 0.79 mg/dL (ref 0.40–1.20)
Chloride: 103 mEq/L (ref 96–112)
GFR: 87.94 mL/min (ref >60.00)
Glucose, Bld: 114 mg/dL — ABNORMAL HIGH (ref 70–99)
Potassium: 4.1 mEq/L (ref 3.5–5.1)
SODIUM: 139 meq/L (ref 135–145)
TOTAL PROTEIN: 7.7 g/dL (ref 6.0–8.3)

## 2016-02-08 LAB — HIV ANTIBODY (ROUTINE TESTING W REFLEX): HIV: NONREACTIVE

## 2016-02-08 LAB — TSH: TSH: 1.27 u[IU]/mL (ref 0.35–4.50)

## 2016-02-15 ENCOUNTER — Encounter: Payer: Self-pay | Admitting: Family Medicine

## 2016-02-19 ENCOUNTER — Ambulatory Visit (INDEPENDENT_AMBULATORY_CARE_PROVIDER_SITE_OTHER): Payer: BLUE CROSS/BLUE SHIELD | Admitting: Family Medicine

## 2016-02-19 ENCOUNTER — Encounter: Payer: Self-pay | Admitting: Family Medicine

## 2016-02-19 VITALS — BP 128/62 | HR 94 | Temp 97.9°F | Ht 62.0 in | Wt 189.5 lb

## 2016-02-19 DIAGNOSIS — N3010 Interstitial cystitis (chronic) without hematuria: Secondary | ICD-10-CM

## 2016-02-19 DIAGNOSIS — F418 Other specified anxiety disorders: Secondary | ICD-10-CM

## 2016-02-19 DIAGNOSIS — G40909 Epilepsy, unspecified, not intractable, without status epilepticus: Secondary | ICD-10-CM

## 2016-02-19 DIAGNOSIS — Z0000 Encounter for general adult medical examination without abnormal findings: Secondary | ICD-10-CM

## 2016-02-19 DIAGNOSIS — F329 Major depressive disorder, single episode, unspecified: Secondary | ICD-10-CM

## 2016-02-19 DIAGNOSIS — F419 Anxiety disorder, unspecified: Principal | ICD-10-CM

## 2016-02-19 DIAGNOSIS — F32A Depression, unspecified: Secondary | ICD-10-CM

## 2016-02-19 DIAGNOSIS — Z01419 Encounter for gynecological examination (general) (routine) without abnormal findings: Secondary | ICD-10-CM | POA: Insufficient documentation

## 2016-02-19 NOTE — Progress Notes (Signed)
Subjective:   Patient ID: Kristen [name], female    DOB: 1981-11-04, 35 y.o.   MRN: [id number]  Kristen [name] is a pleasant 35 y.o. year old female who presents to clinic today with Annual Exam  on 02/19/2016  HPI:   Remote h/o  Hysterectomy.  Not taking anything for her anxiety.  Feels this has been ok.  Diagnosed with seizure disorder last year but is no longer on any seizure rx.   Per pt, follow up EEG was normal.   Not currently taking any rx and has no complaints.  No current outpatient prescriptions on file prior to visit.   No current facility-administered medications on file prior to visit.    Allergies  Allergen Reactions  . Amitriptyline Other (See Comments)    Daytime drowsiness  . Penicillins Rash    Past Medical History  Diagnosis Date  . Syncope   . Depression   . GERD (gastroesophageal reflux disease)   . Frequent headaches   . Fainting spell   . Endometriosis     Past Surgical History  Procedure Laterality Date  . Abdominal hysterectomy    . Exploratory laparotomy      Family History  Problem Relation Age of Onset  . Hyperlipidemia Mother   . Hypertension Mother   . Diabetes Mother   . Cancer Maternal Grandmother   . Mental illness Maternal Grandmother     Social History   Social History  . Marital Status: Single    Spouse Name: N/A  . Number of Children: N/A  . Years of Education: N/A   Occupational History  . Not on file.   Social History Main Topics  . Smoking status: Former Research scientist (life sciences)  . Smokeless tobacco: Never Used     Comment: quit 10 yrs ago   . Alcohol Use: Yes     Comment: seldom drinks   . Drug Use: No  . Sexual Activity: Yes   Other Topics Concern  . Not on file   Social History Narrative   Married, daughter is 3 1/2, son is 17 months. 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     The PMH, PSH, Social History, Family History, Medications, and allergies have been reviewed in [facility],  and have been updated if relevant.  Review of Systems  Constitutional: Negative.   HENT: Negative.   Eyes: Negative.   Respiratory: Negative.   Cardiovascular: Negative.   Gastrointestinal: Negative.   Genitourinary: Negative for dysuria, urgency, frequency, hematuria, flank pain, decreased urine volume, vaginal bleeding, vaginal discharge, enuresis, difficulty urinating, genital sores, vaginal pain, menstrual problem, pelvic pain and dyspareunia.  Musculoskeletal: Negative.   Skin: Negative.   Neurological: Negative.   Psychiatric/Behavioral: Negative for suicidal ideas, hallucinations, behavioral problems, confusion, sleep disturbance, self-injury, dysphoric mood, decreased concentration and agitation. The patient is nervous/anxious. The patient is not hyperactive.        Objective:    BP 128/62 mmHg  Pulse 94  Temp(Src) 97.9 F (36.6 C) (Oral)  Ht 5\' 2"  (1.575 m)  Wt 189 lb 8 oz (85.957 kg)  BMI 34.65 kg/m2  SpO2 96%   Physical Exam  Constitutional: She is oriented to person, place, and time. She appears well-developed and well-nourished. No distress.  HENT:  Head: Normocephalic.  Eyes: Conjunctivae are normal.  Neck: Normal range of motion.  Cardiovascular: Normal rate.   Pulmonary/Chest: Effort normal. No respiratory distress.  Musculoskeletal: Normal range of motion.  Neurological: She is alert and oriented  to person, place, and time. No cranial nerve deficit.  Skin: Skin is warm and dry.  Psychiatric: She has a normal mood and affect. Her behavior is normal. Judgment and thought content normal.  Nursing note and vitals reviewed.         Assessment & Plan:   Anxiety and depression  Interstitial cystitis  Well woman exam  Seizure disorder ([place]) No Follow-up on file.

## 2016-02-19 NOTE — Assessment & Plan Note (Signed)
Per pt, no further seizure activity and clear from neuro standpoint.  Will request records.

## 2016-02-19 NOTE — Patient Instructions (Signed)
Great to see you. Please let me know if we can help with your children.

## 2016-02-19 NOTE — Assessment & Plan Note (Signed)
Reviewed preventive care protocols, scheduled due services, and updated immunizations Discussed nutrition, exercise, diet, and healthy lifestyle.  

## 2016-02-19 NOTE — Progress Notes (Signed)
Pre visit review using our clinic review tool, if applicable. No additional management support is needed unless otherwise documented below in the visit note. 

## 2016-04-04 ENCOUNTER — Encounter: Payer: Self-pay | Admitting: Family Medicine

## 2016-07-24 ENCOUNTER — Encounter: Payer: Self-pay | Admitting: Family Medicine

## 2016-11-18 IMAGING — CT CT HEAD WITHOUT CONTRAST
1 series · 16 of 30 positions shown, 20 images · non-contrast
Comparison: 12/30/2011.

CLINICAL DATA: Syncope. Headache. Vomiting.Fall 6 days ago,
abrasion to forehead, syncope today, headache since fall, nausea, no
neuro hx., no hx of cancer

EXAM:
CT HEAD WITHOUT CONTRAST
TECHNIQUE: Contiguous axial images were obtained from the base of the skull
through the vertex without intravenous contrast.

[Series 2: head wo · axial · 0.43mm/px · z∈[-147,-21]mm · 16 of 32 slices shown, 20 images]
[im 2/32  brain]
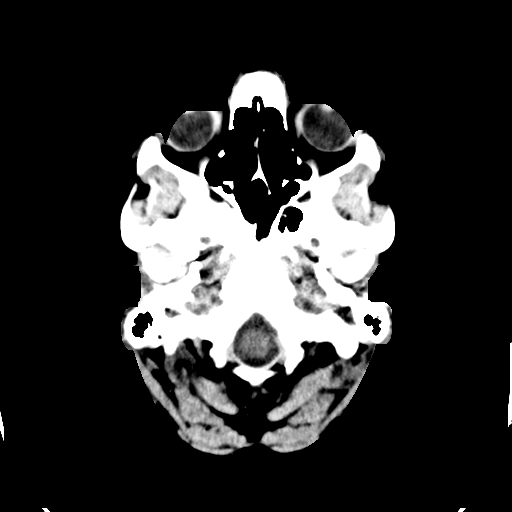
[im 2/32  bone]
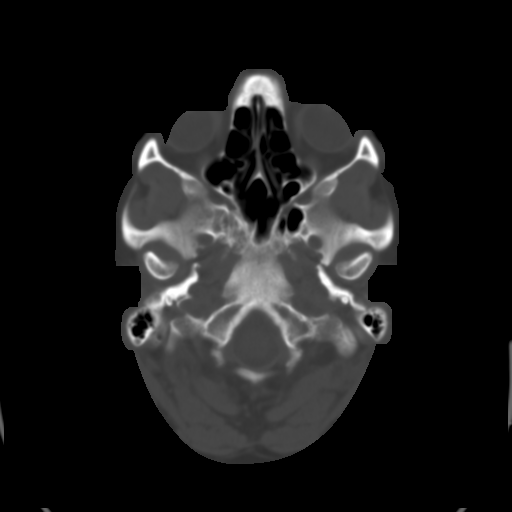
[im 4/32  brain]
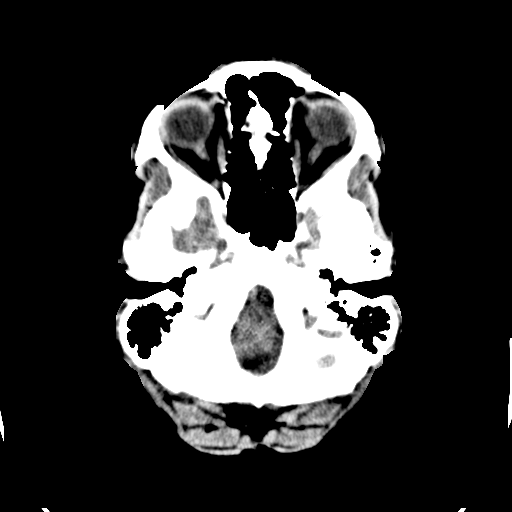
[im 6/32  brain]
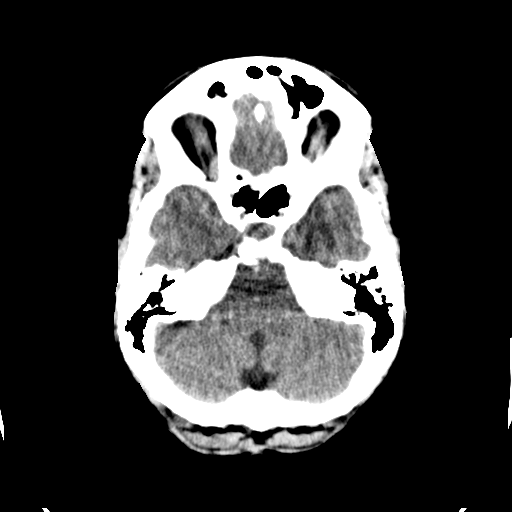
[im 8/32  brain]
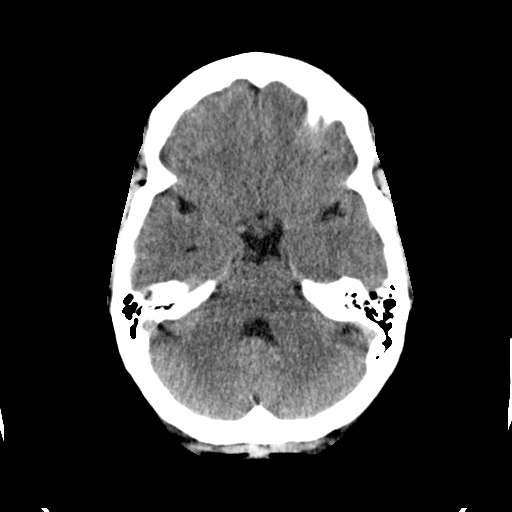
[im 9/32  brain]
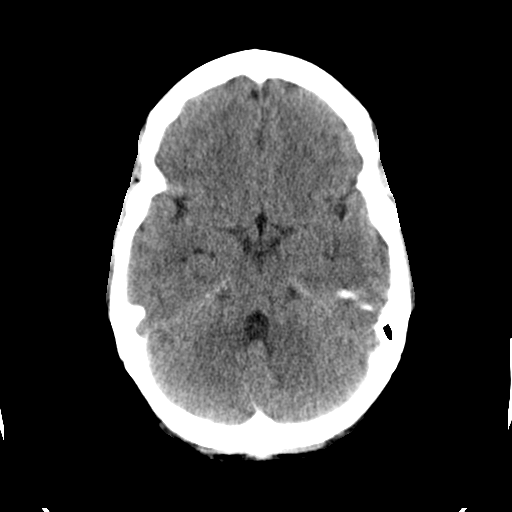
[im 9/32  bone]
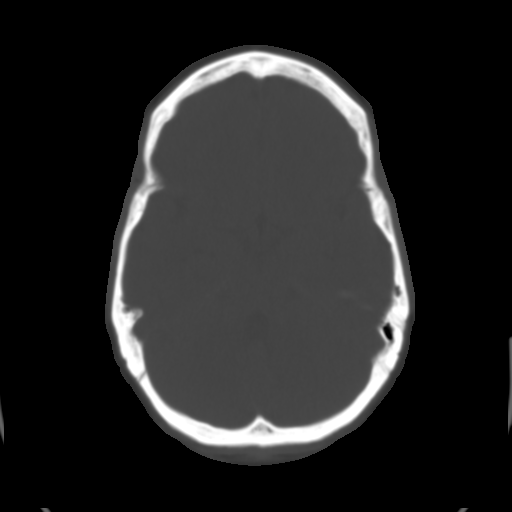
[im 11/32  brain]
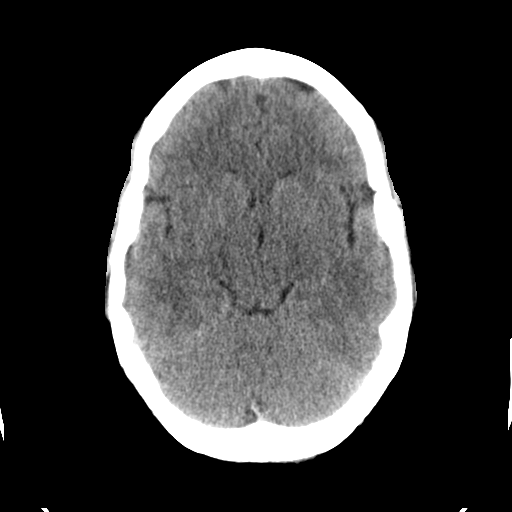
[im 13/32  brain]
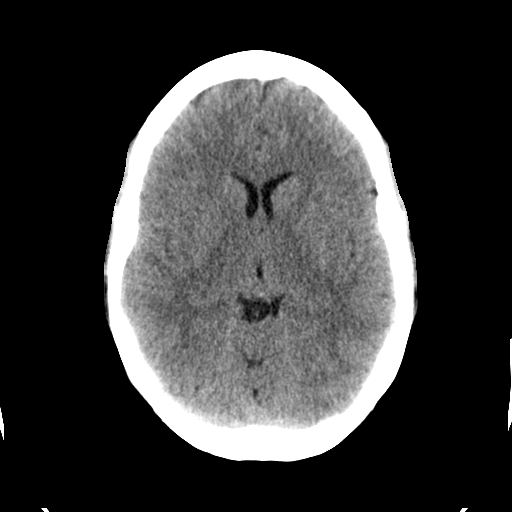
[im 15/32  brain]
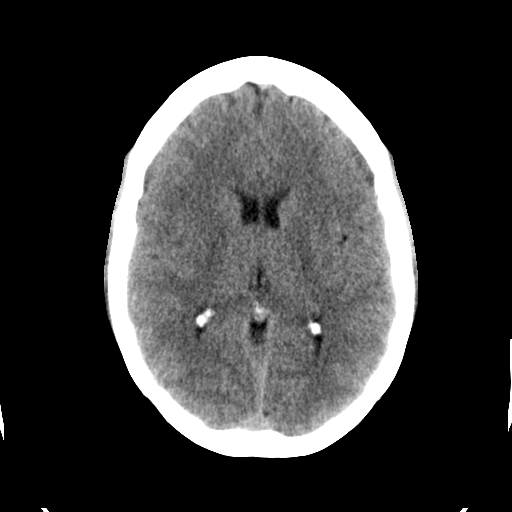
[im 17/32  brain]
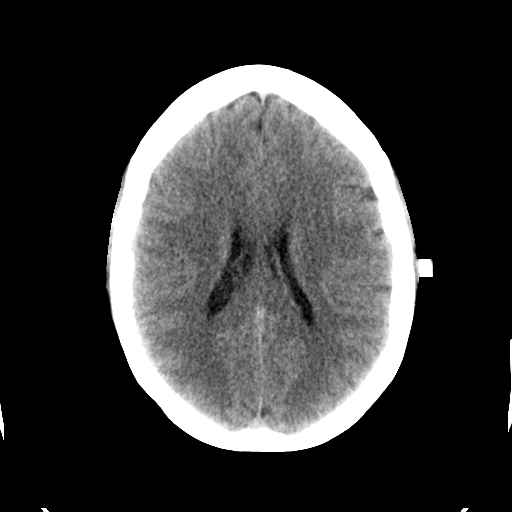
[im 17/32  bone]
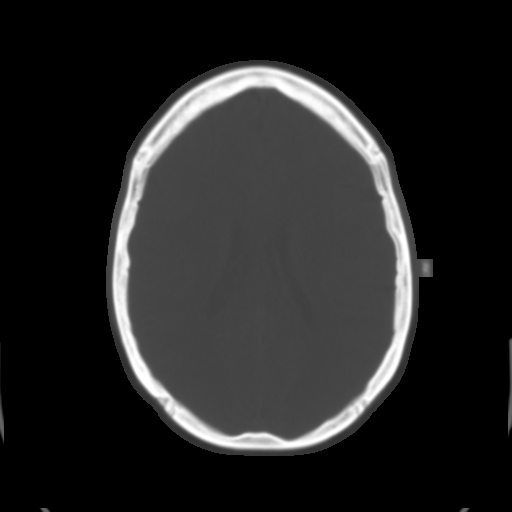
[im 19/32  brain]
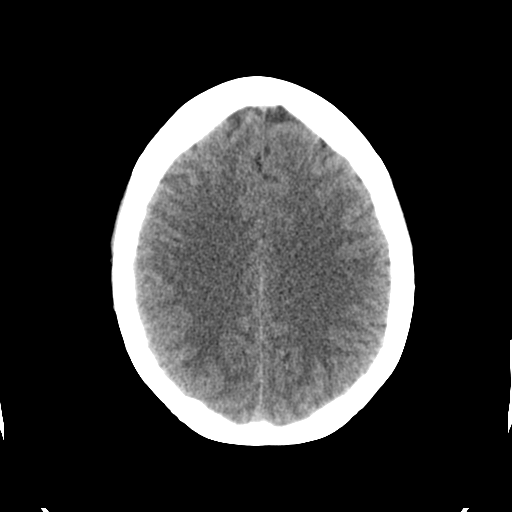
[im 21/32  brain]
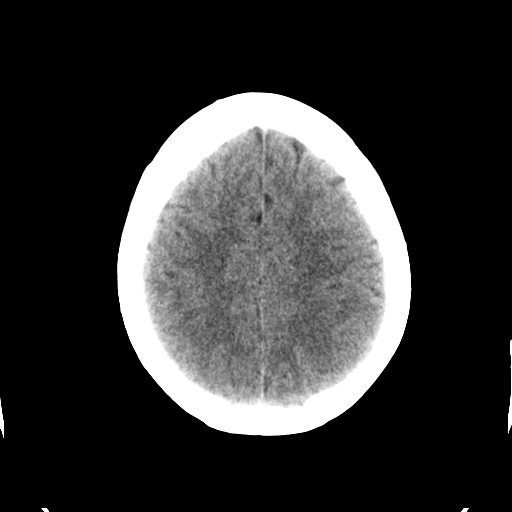
[im 23/32  brain]
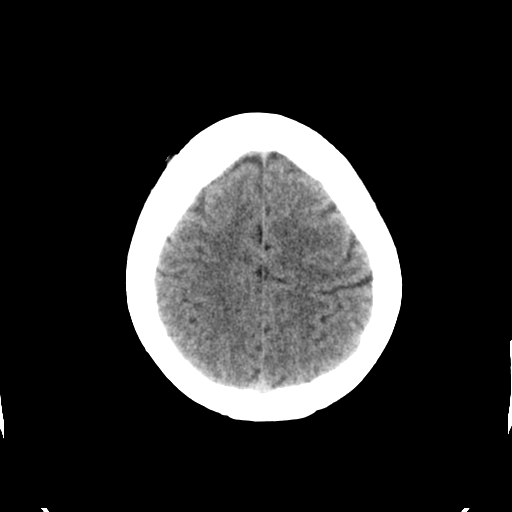
[im 24/32  brain]
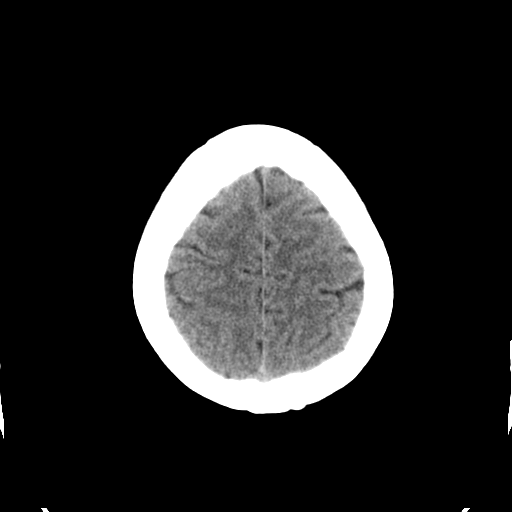
[im 24/32  bone]
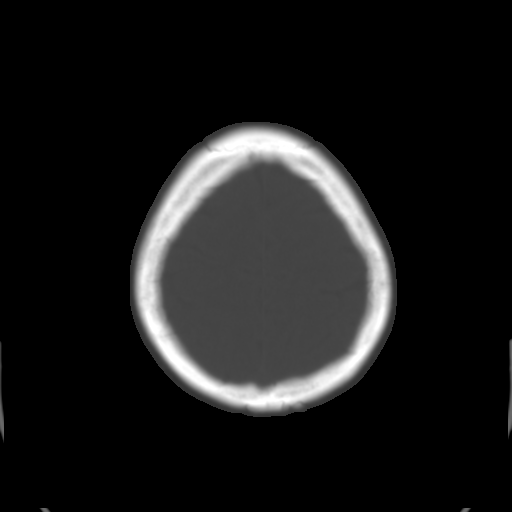
[im 26/32  brain]
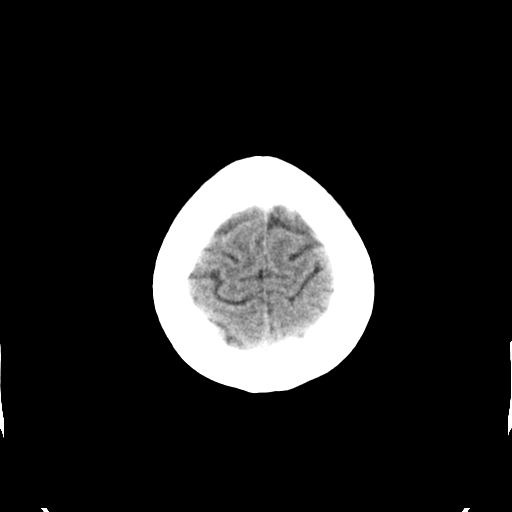
[im 28/32  brain]
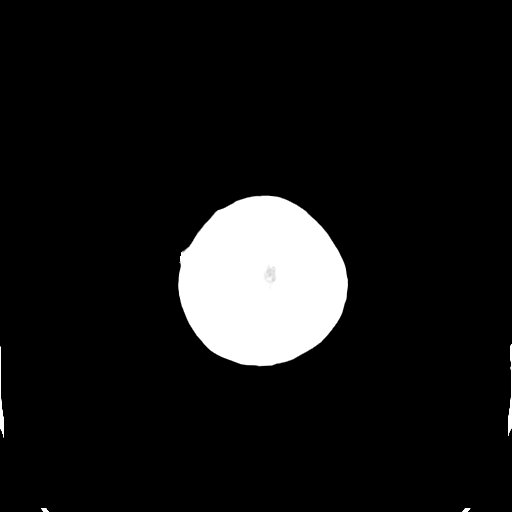
[im 30/32  brain]
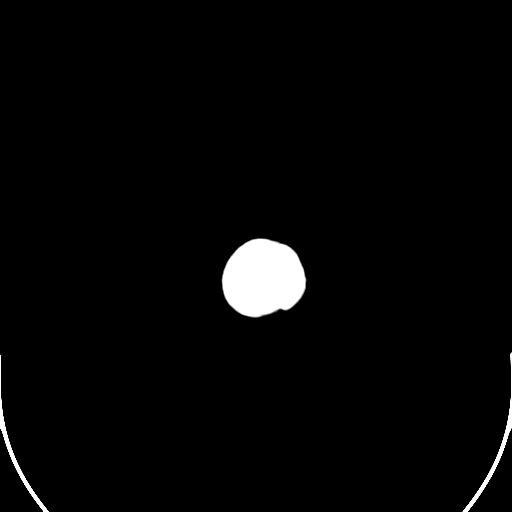

[16 of 30 positions shown; findings below may reference images not displayed]

FINDINGS: No mass lesion, mass effect, midline shift, hydrocephalus,
hemorrhage. No territorial ischemia or acute infarction. There
calcified lesions in the scalp subcutaneous fat bilaterally, likely
representing calcified sebaceous cysts.
IMPRESSION: Negative CT head.

## 2017-08-05 ENCOUNTER — Encounter: Payer: Self-pay | Admitting: Family Medicine

## 2018-12-22 ENCOUNTER — Encounter: Payer: Self-pay | Admitting: Family Medicine

## 2019-07-21 ENCOUNTER — Encounter: Payer: Self-pay | Admitting: Family Medicine

## 2019-07-22 ENCOUNTER — Telehealth: Payer: Self-pay | Admitting: Family Medicine

## 2019-07-22 NOTE — Telephone Encounter (Signed)
I called and had to leave a message for pt to call back and schedule.

## 2019-07-22 NOTE — Telephone Encounter (Signed)
Yes that is okay with me. 

## 2019-07-22 NOTE — Telephone Encounter (Signed)
Pt called in to schedule a re-establish care apt with Dr. Deborra Medina.    Pt has her kids scheduled for 10/26 she would like to know if appt could be on the same day?

## 2019-07-23 NOTE — Telephone Encounter (Signed)
I set pt up with Dr Bryan Lemma because she said the only days she would be able to come are Thursdays or Fridays, Pt was fine with that because she had to re-establish anyway

## 2019-08-12 ENCOUNTER — Ambulatory Visit (INDEPENDENT_AMBULATORY_CARE_PROVIDER_SITE_OTHER): Payer: BLUE CROSS/BLUE SHIELD | Admitting: Family Medicine

## 2019-08-12 ENCOUNTER — Encounter: Payer: Self-pay | Admitting: Family Medicine

## 2019-08-12 ENCOUNTER — Other Ambulatory Visit: Payer: Self-pay

## 2019-08-12 VITALS — BP 120/84 | HR 107 | Temp 98.2°F | Ht 62.0 in | Wt 195.6 lb

## 2019-08-12 DIAGNOSIS — Z0000 Encounter for general adult medical examination without abnormal findings: Secondary | ICD-10-CM

## 2019-08-12 DIAGNOSIS — E669 Obesity, unspecified: Secondary | ICD-10-CM

## 2019-08-12 DIAGNOSIS — Z2821 Immunization not carried out because of patient refusal: Secondary | ICD-10-CM

## 2019-08-12 NOTE — Patient Instructions (Signed)
Health Maintenance, Female Adopting a healthy lifestyle and getting preventive care are important in promoting health and wellness. Ask your health care provider about:  The right schedule for you to have regular tests and exams.  Things you can do on your own to prevent diseases and keep yourself healthy. What should I know about diet, weight, and exercise? Eat a healthy diet   Eat a diet that includes plenty of vegetables, fruits, low-fat dairy products, and lean protein.  Do not eat a lot of foods that are high in solid fats, added sugars, or sodium. Maintain a healthy weight Body mass index (BMI) is used to identify weight problems. It estimates body fat based on height and weight. Your health care provider can help determine your BMI and help you achieve or maintain a healthy weight. Get regular exercise Get regular exercise. This is one of the most important things you can do for your health. Most adults should:  Exercise for at least 150 minutes each week. The exercise should increase your heart rate and make you sweat (moderate-intensity exercise).  Do strengthening exercises at least twice a week. This is in addition to the moderate-intensity exercise.  Spend less time sitting. Even light physical activity can be beneficial. Watch cholesterol and blood lipids Have your blood tested for lipids and cholesterol at 38 years of age, then have this test every 5 years. Have your cholesterol levels checked more often if:  Your lipid or cholesterol levels are high.  You are older than 38 years of age.  You are at high risk for heart disease. What should I know about cancer screening? Depending on your health history and family history, you may need to have cancer screening at various ages. This may include screening for:  Breast cancer.  Cervical cancer.  Colorectal cancer.  Skin cancer.  Lung cancer. What should I know about heart disease, diabetes, and high blood  pressure? Blood pressure and heart disease  High blood pressure causes heart disease and increases the risk of stroke. This is more likely to develop in people who have high blood pressure readings, are of African descent, or are overweight.  Have your blood pressure checked: ? Every 3-5 years if you are 18-39 years of age. ? Every year if you are 40 years old or older. Diabetes Have regular diabetes screenings. This checks your fasting blood sugar level. Have the screening done:  Once every three years after age 40 if you are at a normal weight and have a low risk for diabetes.  More often and at a younger age if you are overweight or have a high risk for diabetes. What should I know about preventing infection? Hepatitis B If you have a higher risk for hepatitis B, you should be screened for this virus. Talk with your health care provider to find out if you are at risk for hepatitis B infection. Hepatitis C Testing is recommended for:  Everyone born from 1945 through 1965.  Anyone with known risk factors for hepatitis C. Sexually transmitted infections (STIs)  Get screened for STIs, including gonorrhea and chlamydia, if: ? You are sexually active and are younger than 38 years of age. ? You are older than 38 years of age and your health care provider tells you that you are at risk for this type of infection. ? Your sexual activity has changed since you were last screened, and you are at increased risk for chlamydia or gonorrhea. Ask your health care provider if   you are at risk.  Ask your health care provider about whether you are at high risk for HIV. Your health care provider may recommend a prescription medicine to help prevent HIV infection. If you choose to take medicine to prevent HIV, you should first get tested for HIV. You should then be tested every 3 months for as long as you are taking the medicine. Pregnancy  If you are about to stop having your period (premenopausal) and  you may become pregnant, seek counseling before you get pregnant.  Take 400 to 800 micrograms (mcg) of folic acid every day if you become pregnant.  Ask for birth control (contraception) if you want to prevent pregnancy. Osteoporosis and menopause Osteoporosis is a disease in which the bones lose minerals and strength with aging. This can result in bone fractures. If you are 65 years old or older, or if you are at risk for osteoporosis and fractures, ask your health care provider if you should:  Be screened for bone loss.  Take a calcium or vitamin D supplement to lower your risk of fractures.  Be given hormone replacement therapy (HRT) to treat symptoms of menopause. Follow these instructions at home: Lifestyle  Do not use any products that contain nicotine or tobacco, such as cigarettes, e-cigarettes, and chewing tobacco. If you need help quitting, ask your health care provider.  Do not use street drugs.  Do not share needles.  Ask your health care provider for help if you need support or information about quitting drugs. Alcohol use  Do not drink alcohol if: ? Your health care provider tells you not to drink. ? You are pregnant, may be pregnant, or are planning to become pregnant.  If you drink alcohol: ? Limit how much you use to 0-1 drink a day. ? Limit intake if you are breastfeeding.  Be aware of how much alcohol is in your drink. In the [place] one drink equals one 12 oz bottle of beer (355 mL), one 5 oz glass of wine (148 mL), or one 1 oz glass of hard liquor (44 mL). General instructions  Schedule regular health, dental, and eye exams.  Stay current with your vaccines.  Tell your health care provider if: ? You often feel depressed. ? You have ever been abused or do not feel safe at home. Summary  Adopting a healthy lifestyle and getting preventive care are important in promoting health and wellness.  Follow your health care provider's instructions about healthy  diet, exercising, and getting tested or screened for diseases.  Follow your health care provider's instructions on monitoring your cholesterol and blood pressure. This information is not intended to replace advice given to you by your health care provider. Make sure you discuss any questions you have with your health care provider. Document Released: 05/20/2011 Document Revised: 10/28/2018 Document Reviewed: 10/28/2018 Elsevier Patient Education  2020 Elsevier Inc.  

## 2019-08-12 NOTE — Progress Notes (Signed)
Kristen [name] is a 38 y.o. female  Chief Complaint  Patient presents with  . Establish Care    est care/ CPE     HPI: Kristen [name] is a 38 y.o. female here to re-establish care and for annual CPE. She is not fasting and will RTO for labs.   Last PAP: s/p partial hysterectomy (still has B/L ovaries) but pt states she does not need PAP  Diet/Exercise: "not healthy" - a lot of carbs, fast food, sweets, soda; no regular exercise  Dentist - 2019 - overdue d/t covid Vision - pt wears glasses/contacts and last exam in 05/2019   Past Medical History:  Diagnosis Date  . Depression   . Endometriosis   . Fainting spell   . Frequent headaches   . GERD (gastroesophageal reflux disease)   . Syncope     Past Surgical History:  Procedure Laterality Date  . ABDOMINAL HYSTERECTOMY    . EXPLORATORY LAPAROTOMY      Social History   Socioeconomic History  . Marital status: Married    Spouse name: Not on file  . Number of children: Not on file  . Years of education: Not on file  . Highest education level: Not on file  Occupational History  . Not on file  Social Needs  . Financial resource strain: Not on file  . Food insecurity    Worry: Not on file    Inability: Not on file  . Transportation needs    Medical: Not on file    Non-medical: Not on file  Tobacco Use  . Smoking status: Former Research scientist (life sciences)  . Smokeless tobacco: Never Used  . Tobacco comment: quit 10 yrs ago   Substance and Sexual Activity  . Alcohol use: Yes    Comment: seldom drinks   . Drug use: No  . Sexual activity: Yes  Lifestyle  . Physical activity    Days per week: Not on file    Minutes per session: Not on file  . Stress: Not on file  Relationships  . Social Herbalist on phone: Not on file    Gets together: Not on file    Attends religious service: Not on file    Active member of club or organization: Not on file    Attends meetings of clubs or organizations: Not on file    Relationship  status: Not on file  . Intimate partner violence    Fear of current or ex partner: Not on file    Emotionally abused: Not on file    Physically abused: Not on file    Forced sexual activity: Not on file  Other Topics Concern  . Not on file  Social History Narrative   Married, daughter is 3 1/2, son is 78 months. 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      Family History  Problem Relation Age of Onset  . Hyperlipidemia Mother   . Hypertension Mother   . Diabetes Mother   . Cancer Maternal Grandmother   . Mental illness Maternal Grandmother       There is no immunization history on file for this patient.  Outpatient Encounter Medications as of 08/12/2019  Medication Sig  . Multiple Vitamin (MULTIVITAMIN) tablet Take 1 tablet by mouth daily.   No facility-administered encounter medications on file as of 08/12/2019.      ROS: Gen: no fever, chills  Skin: no rash, itching ENT: no ear pain, ear drainage,  nasal congestion, rhinorrhea, sinus pressure, sore throat Eyes: no blurry vision, double vision Resp: no cough, wheeze,SOB Breast: no breast tenderness, no nipple discharge, no breast masses CV: no CP, palpitations, LE edema,  GI: no heartburn, n/v/d/c, abd pain GU: no dysuria, urgency, frequency, hematuria MSK: no joint pain, myalgias, back pain Neuro: no dizziness, headache, weakness, vertigo Psych: no depression, anxiety, insomnia   Allergies  Allergen Reactions  . Amitriptyline Other (See Comments)    Daytime drowsiness  . Penicillins Rash    rash     BP 120/84   Pulse (!) 107   Temp 98.2 F (36.8 C) (Oral)   Ht 5\' 2"  (1.575 m)   Wt 195 lb 9.6 oz (88.7 kg)   SpO2 98%   BMI 35.78 kg/m   BP Readings from Last 3 Encounters:  08/12/19 120/84  02/19/16 128/62  01/16/15 122/82   Pulse Readings from Last 3 Encounters:  08/12/19 (!) 107  02/19/16 94  01/16/15 88   Physical Exam  Constitutional: She is oriented to person,  place, and time. She appears well-developed and well-nourished. No distress.  HENT:  Head: Normocephalic and atraumatic.  Right Ear: Tympanic membrane and ear canal normal.  Left Ear: Tympanic membrane and ear canal normal.  Nose: Nose normal.  Mouth/Throat: Oropharynx is clear and moist and mucous membranes are normal.  Eyes: Pupils are equal, round, and reactive to light. Conjunctivae are normal.  Neck: Neck supple. No thyromegaly present.  Cardiovascular: Normal rate, regular rhythm, normal heart sounds and intact distal pulses.  No murmur heard. Pulmonary/Chest: Effort normal and breath sounds normal. No respiratory distress. She has no wheezes. She has no rhonchi.  Abdominal: Soft. Bowel sounds are normal. She exhibits no distension and no mass. There is no abdominal tenderness.  Musculoskeletal:        General: No edema.  Lymphadenopathy:    She has no cervical adenopathy.  Neurological: She is alert and oriented to person, place, and time. She exhibits normal muscle tone. Coordination normal.  Skin: Skin is warm and dry.  Psychiatric: She has a normal mood and affect. Her behavior is normal.    A/P:  1. Annual physical exam - UTD on vision exam, overdue for dental exam - pt declines PAP as she is s/p partial hysterectomy and states she was told she lo longer needs PAP and can't be convinced otherwise - discussed importance of regular CV exercise, healthy diet, adequate sleep. Limit fast food, carbs, sugars; increase water intake - ALT; Future - AST; Future - Basic metabolic panel; Future - Lipid panel; Future - VITAMIN D 25 Hydroxy (Vit-D Deficiency, Fractures); Future - next CPE in 1 year - declines flu vaccine today

## 2019-09-13 ENCOUNTER — Other Ambulatory Visit: Payer: BLUE CROSS/BLUE SHIELD

## 2019-10-21 ENCOUNTER — Encounter: Payer: Self-pay | Admitting: Family Medicine

## 2019-11-22 ENCOUNTER — Encounter: Payer: Self-pay | Admitting: Family Medicine

## 2019-11-22 NOTE — Telephone Encounter (Signed)
I called Kristen [name] back and informed her of Kristen [name] message.  Kristen [name] wasn't happy about taking her soon to UC,  she explained that there is no point of calling the doctor office if the patients are not being seen.  She said that it was pointless to call her back just to tell her that he needs to go to UC.  She said thanks a lot for nothing and that her son is going to die because this office isn't seeing sick patient and then hung up on the call.

## 2019-12-06 NOTE — Telephone Encounter (Signed)
Reached out to parent, no call back to date.

## 2020-03-04 ENCOUNTER — Ambulatory Visit: Payer: Self-pay | Attending: Internal Medicine

## 2020-03-04 DIAGNOSIS — Z23 Encounter for immunization: Secondary | ICD-10-CM

## 2020-03-04 NOTE — Progress Notes (Signed)
   Covid-19 Vaccination Clinic  Name:  DESYREE MEIDINGER    MRN: [id number] DOB: [birth year]  03/04/2020  Ms. Albano was observed post Covid-19 immunization for 15 minutes without incident. She was provided with Vaccine Information Sheet and instruction to access the V-Safe system.   Ms. Belleau was instructed to call 911 with any severe reactions post vaccine: Marland Kitchen Difficulty breathing  . Swelling of face and throat  . A fast heartbeat  . A bad rash all over body  . Dizziness and weakness   Immunizations Administered    Name Date Dose VIS Date Route   Pfizer COVID-19 Vaccine 03/04/2020  9:04 AM 0.3 mL 10/29/2019 Intramuscular   Manufacturer: [employer]   Lot: [id number]   [place]: [id number]

## 2020-03-25 ENCOUNTER — Ambulatory Visit: Payer: Self-pay | Attending: Internal Medicine

## 2020-03-25 DIAGNOSIS — Z23 Encounter for immunization: Secondary | ICD-10-CM

## 2020-03-25 NOTE — Progress Notes (Signed)
   Covid-19 Vaccination Clinic  Name:  Kristen [name]    MRN: [id number] DOB: June 24, 1981  03/25/2020  Kristen [name] was observed post Covid-19 immunization for 15 minutes without incident. She was provided with Vaccine Information Sheet and instruction to access the V-Safe system.   Kristen [name] was instructed to call 911 with any severe reactions post vaccine: Marland Kitchen Difficulty breathing  . Swelling of face and throat  . A fast heartbeat  . A bad rash all over body  . Dizziness and weakness   Immunizations Administered    Name Date Dose VIS Date Route   Pfizer COVID-19 Vaccine 03/25/2020 11:01 AM 0.3 mL 01/12/2019 Intramuscular   Manufacturer: [place]   Lot: [id number]   [place]: [id number]

## 2020-11-09 ENCOUNTER — Ambulatory Visit (INDEPENDENT_AMBULATORY_CARE_PROVIDER_SITE_OTHER): Payer: 59 | Admitting: Family Medicine

## 2020-11-09 ENCOUNTER — Encounter: Payer: Self-pay | Admitting: Family Medicine

## 2020-11-09 ENCOUNTER — Other Ambulatory Visit: Payer: Self-pay

## 2020-11-09 VITALS — BP 118/74 | HR 81 | Temp 97.7°F | Ht 62.25 in | Wt 200.0 lb

## 2020-11-09 DIAGNOSIS — Z2821 Immunization not carried out because of patient refusal: Secondary | ICD-10-CM

## 2020-11-09 DIAGNOSIS — G40909 Epilepsy, unspecified, not intractable, without status epilepticus: Secondary | ICD-10-CM

## 2020-11-09 DIAGNOSIS — Z13220 Encounter for screening for lipoid disorders: Secondary | ICD-10-CM | POA: Diagnosis not present

## 2020-11-09 DIAGNOSIS — Z0000 Encounter for general adult medical examination without abnormal findings: Secondary | ICD-10-CM | POA: Diagnosis not present

## 2020-11-09 DIAGNOSIS — F32A Depression, unspecified: Secondary | ICD-10-CM

## 2020-11-09 DIAGNOSIS — F419 Anxiety disorder, unspecified: Secondary | ICD-10-CM | POA: Diagnosis not present

## 2020-11-09 DIAGNOSIS — Z1321 Encounter for screening for nutritional disorder: Secondary | ICD-10-CM

## 2020-11-09 MED ORDER — ESCITALOPRAM OXALATE 10 MG PO TABS: 10.0000 mg | ORAL_TABLET | Freq: Every day | ORAL | 2 refills | Status: DC

## 2020-11-09 NOTE — Patient Instructions (Signed)

## 2020-11-09 NOTE — Progress Notes (Signed)
Kristen [name] is a 39 y.o. female  Chief Complaint  Patient presents with  . Annual Exam    CPE/labs.  Fasting today.  C/o having some depression. Declines flu shot today.     HPI: Kristen [name] is a 39 y.o. female seen today for annual CPE, fasting labs.  She declines flu vaccine today.  She is married, 2 children 59yo and 15yo  Last PAP: unsure Dental: due - last done prior to covid Vision: pt wears contacts/glasses - UTD on exam  Med refills needed today? None  Pt did counseling a long time ago and did not like it or feel counselor was a good fit.  She does not remember if she has been on meds for depression or anxiety in the past.  She has trouble with her memory and this is frustrating.  Her husband encouraged her to make this appt to "get medicine to help"  Depression screen [facility] 2/9 11/09/2020 02/19/2016  Decreased Interest 1 0  Down, Depressed, Hopeless 1 0  PHQ - 2 Score 2 0  Altered sleeping 0 -  Tired, decreased energy 3 -  Change in appetite 3 -  Feeling bad or failure about yourself  1 -  Trouble concentrating 3 -  Moving slowly or fidgety/restless 3 -  Suicidal thoughts 0 -  PHQ-9 Score 15 -  Difficult doing work/chores Very difficult -     Past Medical History:  Diagnosis Date  . Depression   . Endometriosis   . Fainting spell   . Frequent headaches   . GERD (gastroesophageal reflux disease)   . Syncope     Past Surgical History:  Procedure Laterality Date  . ABDOMINAL HYSTERECTOMY    . EXPLORATORY LAPAROTOMY      Social History   Socioeconomic History  . Marital status: Married    Spouse name: Not on file  . Number of children: Not on file  . Years of education: Not on file  . Highest education level: Not on file  Occupational History  . Not on file  Tobacco Use  . Smoking status: Former Research scientist (life sciences)  . Smokeless tobacco: Never Used  . Tobacco comment: quit 10 yrs ago   Vaping Use  . Vaping Use: Never used  Substance and Sexual  Activity  . Alcohol use: Yes    Comment: seldom drinks   . Drug use: No  . Sexual activity: Yes  Other Topics Concern  . Not on file  Social History Narrative   Married, daughter is 3 1/2, son is 30 months. 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     Social Determinants of Health   Financial Resource Strain: Not on file  Food Insecurity: Not on file  Transportation Needs: Not on file  Physical Activity: Not on file  Stress: Not on file  Social Connections: Not on file  Intimate Partner Violence: Not on file    Family History  Problem Relation Age of Onset  . Hyperlipidemia Mother   . Hypertension Mother   . Diabetes Mother   . Cancer Maternal Grandmother   . Mental illness Maternal Grandmother      Immunization History  Administered Date(s) Administered  . PFIZER SARS-COV-2 Vaccination 03/04/2020, 03/25/2020    Outpatient Encounter Medications as of 11/09/2020  Medication Sig  . diphenhydrAMINE (ALLERGY) 25 MG tablet Take 25 mg by mouth every 6 (six) hours as needed.  . Multiple Vitamin (MULTIVITAMIN) tablet Take 1 tablet by mouth daily.  Marland Kitchen  omeprazole (PRILOSEC OTC) 20 MG tablet Take 20 mg by mouth daily.   No facility-administered encounter medications on file as of 11/09/2020.     ROS: Gen: no fever, chills  Skin: no rash, itching ENT: no ear pain, ear drainage, nasal congestion, rhinorrhea, sinus pressure, sore throat Eyes: no blurry vision, double vision Resp: no cough, wheeze,SOB Breast: no breast tenderness, no nipple discharge, no breast masses CV: no CP, palpitations, LE edema,  GI: no heartburn, n/v/d/c, abd pain GU: no dysuria, urgency, frequency, hematuria MSK: no joint pain, myalgias, back pain Neuro: no dizziness, headache, weakness, vertigo Psych: as above in HPI   Allergies  Allergen Reactions  . Amitriptyline Other (See Comments)    Daytime drowsiness  . Penicillins Rash    rash     BP 118/74   Pulse  81   Temp 97.7 F (36.5 C) (Temporal)   Ht 5' 2.25" (1.581 m)   Wt 200 lb (90.7 kg)   SpO2 96%   BMI 36.29 kg/m   Physical Exam Constitutional:      General: She is not in acute distress.    Appearance: She is well-developed and well-nourished.  HENT:     Head: Normocephalic and atraumatic.     Right Ear: Tympanic membrane and ear canal normal.     Left Ear: Tympanic membrane and ear canal normal.     Nose: Nose normal.     Mouth/Throat:     Mouth: Oropharynx is clear and moist and mucous membranes are normal.  Eyes:     Conjunctiva/sclera: Conjunctivae normal.     Pupils: Pupils are equal, round, and reactive to light.  Neck:     Thyroid: No thyromegaly.  Cardiovascular:     Rate and Rhythm: Normal rate and regular rhythm.     Pulses: Intact distal pulses.     Heart sounds: Normal heart sounds. No murmur heard.   Pulmonary:     Effort: Pulmonary effort is normal. No respiratory distress.     Breath sounds: Normal breath sounds. No wheezing or rhonchi.  Abdominal:     General: Bowel sounds are normal. There is no distension.     Palpations: Abdomen is soft. There is no mass.     Tenderness: There is no abdominal tenderness.  Musculoskeletal:        General: No edema.     Cervical back: Neck supple.     Right lower leg: No edema.     Left lower leg: No edema.  Lymphadenopathy:     Cervical: No cervical adenopathy.  Skin:    General: Skin is warm and dry.  Neurological:     Mental Status: She is alert and oriented to person, place, and time.     Motor: No abnormal muscle tone.     Coordination: Coordination normal.  Psychiatric:        Mood and Affect: Mood and affect normal.        Behavior: Behavior normal.      A/P:  1. Annual physical exam - discussed importance of regular CV exercise, healthy diet, adequate sleep - due for dental exam, UTD on vision exam - likely due for PAP - will schedule with GYN - declines flu vaccine but did have covid vaccine  (not booster) - ALT - AST - Basic metabolic panel - CBC - Lipid panel - next CPE in 1 year  2. Anxiety and depression - PHQ-9 = 15 Rx: - escitalopram (LEXAPRO) 10 MG tablet; Take 1  tablet (10 mg total) by mouth daily.  Dispense: 30 tablet; Refill: 2 - f/u in 3-4 wks or sooner PRN  3. Seizure disorder ([place]) - dx in 2016, no longer on meds, controlled w/o issues  4. Screening for lipid disorders - Lipid panel  5. Encounter for vitamin deficiency screening - VITAMIN D 25 Hydroxy (Vit-D Deficiency, Fractures)  6. Influenza vaccination declined by patient    This visit occurred during the SARS-CoV-2 public health emergency.  Safety protocols were in place, including screening questions prior to the visit, additional usage of staff PPE, and extensive cleaning of exam room while observing appropriate contact time as indicated for disinfecting solutions.

## 2020-11-10 LAB — LIPID PANEL
Cholesterol: 247 mg/dL — ABNORMAL HIGH
HDL: 42 mg/dL — ABNORMAL LOW
LDL Cholesterol (Calc): 159 mg/dL — ABNORMAL HIGH
Non-HDL Cholesterol (Calc): 205 mg/dL — ABNORMAL HIGH
Total CHOL/HDL Ratio: 5.9 (calc) — ABNORMAL HIGH
Triglycerides: 279 mg/dL — ABNORMAL HIGH

## 2020-11-10 LAB — CBC
HCT: 42.6 % (ref 35.0–45.0)
Hemoglobin: 14.8 g/dL (ref 11.7–15.5)
MCH: 29.1 pg (ref 27.0–33.0)
MCHC: 34.7 g/dL (ref 32.0–36.0)
MCV: 83.9 fL (ref 80.0–100.0)
MPV: 9.7 fL (ref 7.5–12.5)
Platelets: 412 10*3/uL — ABNORMAL HIGH (ref 140–400)
RBC: 5.08 10*6/uL (ref 3.80–5.10)
RDW: 12.5 % (ref 11.0–15.0)
WBC: 8.4 10*3/uL (ref 3.8–10.8)

## 2020-11-10 LAB — AST: AST: 17 U/L (ref 10–30)

## 2020-11-10 LAB — BASIC METABOLIC PANEL WITH GFR
BUN: 11 mg/dL (ref 7–25)
CO2: 27 mmol/L (ref 20–32)
Calcium: 9.4 mg/dL (ref 8.6–10.2)
Chloride: 100 mmol/L (ref 98–110)
Creat: 0.77 mg/dL (ref 0.50–1.10)
Glucose, Bld: 95 mg/dL (ref 65–99)
Potassium: 4.8 mmol/L (ref 3.5–5.3)
Sodium: 140 mmol/L (ref 135–146)

## 2020-11-10 LAB — VITAMIN D 25 HYDROXY (VIT D DEFICIENCY, FRACTURES): Vit D, 25-Hydroxy: 29 ng/mL — ABNORMAL LOW (ref 30–100)

## 2020-11-10 LAB — ALT: ALT: 26 U/L (ref 6–29)

## 2020-11-22 ENCOUNTER — Encounter: Payer: Self-pay | Admitting: Family Medicine

## 2020-11-22 DIAGNOSIS — E785 Hyperlipidemia, unspecified: Secondary | ICD-10-CM | POA: Insufficient documentation

## 2020-12-13 ENCOUNTER — Encounter: Payer: Self-pay | Admitting: Family Medicine

## 2020-12-13 ENCOUNTER — Telehealth (INDEPENDENT_AMBULATORY_CARE_PROVIDER_SITE_OTHER): Payer: 59 | Admitting: Family Medicine

## 2020-12-13 VITALS — Wt 196.0 lb

## 2020-12-13 DIAGNOSIS — F32A Depression, unspecified: Secondary | ICD-10-CM

## 2020-12-13 DIAGNOSIS — F419 Anxiety disorder, unspecified: Secondary | ICD-10-CM

## 2020-12-13 MED ORDER — ESCITALOPRAM OXALATE 10 MG PO TABS: 10.0000 mg | ORAL_TABLET | Freq: Every day | ORAL | 3 refills | Status: DC

## 2020-12-13 NOTE — Progress Notes (Signed)
Virtual Visit via Video Note  I connected with Kristen [name] on 12/13/20 at  2:30 PM EST by a video enabled telemedicine application and verified that I am speaking with the correct person using two identifiers. Location patient: home Location provider: work  Persons participating in the virtual visit: patient, provider  I discussed the limitations of evaluation and management by telemedicine and the availability of in person appointments. The patient expressed understanding and agreed to proceed.  Chief Complaint  Patient presents with  . Follow-up    4 week f/u anxiety/depression. Declines flu shot.      HPI: Kristen [name] is a 40 y.o. female seen today for f/u on anxiety and depression. She was seen by me about 1 mo ago and started on lexapro 10mg  daily. Today she reports, she feels she is doing better overall. Her mood is better, not as frustrated or lashing out.  No side effects.  Sleep is good.  Pt is trying to limit how much she eats.  She would like to stay on med at current dose.   Depression screen [facility] 2/9 11/09/2020 02/19/2016  Decreased Interest 1 0  Down, Depressed, Hopeless 1 0  PHQ - 2 Score 2 0  Altered sleeping 0 -  Tired, decreased energy 3 -  Change in appetite 3 -  Feeling bad or failure about yourself  1 -  Trouble concentrating 3 -  Moving slowly or fidgety/restless 3 -  Suicidal thoughts 0 -  PHQ-9 Score 15 -  Difficult doing work/chores Very difficult -   No flowsheet data found.   Past Medical History:  Diagnosis Date  . Depression   . Endometriosis   . Fainting spell   . Frequent headaches   . GERD (gastroesophageal reflux disease)   . Syncope     Past Surgical History:  Procedure Laterality Date  . ABDOMINAL HYSTERECTOMY    . EXPLORATORY LAPAROTOMY      Family History  Problem Relation Age of Onset  . Hyperlipidemia Mother   . Hypertension Mother   . Diabetes Mother   . Cancer Maternal Grandmother   . Mental illness Maternal  Grandmother     Social History   Tobacco Use  . Smoking status: Former Research scientist (life sciences)  . Smokeless tobacco: Never Used  . Tobacco comment: quit 10 yrs ago   Vaping Use  . Vaping Use: Never used  Substance Use Topics  . Alcohol use: Yes    Comment: seldom drinks   . Drug use: No     Current Outpatient Medications:  Marland Kitchen  Multiple Vitamin (MULTIVITAMIN) tablet, Take 1 tablet by mouth daily., Disp: , Rfl:  .  omeprazole (PRILOSEC OTC) 20 MG tablet, Take 20 mg by mouth daily., Disp: , Rfl:  .  diphenhydrAMINE (BENADRYL) 25 MG tablet, Take 25 mg by mouth every 6 (six) hours as needed. (Patient not taking: Reported on 12/13/2020), Disp: , Rfl:  .  escitalopram (LEXAPRO) 10 MG tablet, Take 1 tablet (10 mg total) by mouth daily., Disp: 90 tablet, Rfl: 3  Allergies  Allergen Reactions  . Amitriptyline Other (See Comments)    Daytime drowsiness  . Penicillins Rash    rash      ROS: See pertinent positives and negatives per HPI.   EXAM:  VITALS per patient if applicable: Wt 196 lb (88.9 kg) Comment: pt reported  BMI 35.56 kg/m    GENERAL: alert, oriented, appears well and in no acute distress  NECK: normal movements of the  head and neck  LUNGS: on inspection no signs of respiratory distress, breathing rate appears normal, no obvious gross SOB, gasping or wheezing, no conversational dyspnea  CV: no obvious cyanosis  PSYCH/NEURO: pleasant and cooperative, speech and thought processing grossly intact   ASSESSMENT AND PLAN:  1. Anxiety and depression - pt feels she is doing well on this med/dose, no side effects Refill: - escitalopram (LEXAPRO) 10 MG tablet; Take 1 tablet (10 mg total) by mouth daily.  Dispense: 90 tablet; Refill: 3 - f/u PRN    I discussed the assessment and treatment plan with the patient. The patient was provided an opportunity to ask questions and all were answered. The patient agreed with the plan and demonstrated an understanding of the instructions.    The patient was advised to call back or seek an in-person evaluation if the symptoms worsen or if the condition fails to improve as anticipated.   Letta Median, DO

## 2021-03-09 ENCOUNTER — Encounter: Payer: Self-pay | Admitting: Family Medicine

## 2021-10-09 ENCOUNTER — Encounter: Payer: 59 | Admitting: Internal Medicine

## 2021-11-14 ENCOUNTER — Ambulatory Visit (INDEPENDENT_AMBULATORY_CARE_PROVIDER_SITE_OTHER): Payer: 59 | Admitting: Family Medicine

## 2021-11-14 ENCOUNTER — Other Ambulatory Visit: Payer: Self-pay

## 2021-11-14 ENCOUNTER — Encounter: Payer: Self-pay | Admitting: Family Medicine

## 2021-11-14 VITALS — BP 118/76 | HR 103 | Temp 97.4°F | Ht 62.0 in | Wt 206.2 lb

## 2021-11-14 DIAGNOSIS — G40909 Epilepsy, unspecified, not intractable, without status epilepticus: Secondary | ICD-10-CM | POA: Diagnosis not present

## 2021-11-14 DIAGNOSIS — N3010 Interstitial cystitis (chronic) without hematuria: Secondary | ICD-10-CM | POA: Diagnosis not present

## 2021-11-14 DIAGNOSIS — Z131 Encounter for screening for diabetes mellitus: Secondary | ICD-10-CM

## 2021-11-14 DIAGNOSIS — E782 Mixed hyperlipidemia: Secondary | ICD-10-CM | POA: Diagnosis not present

## 2021-11-14 DIAGNOSIS — K649 Unspecified hemorrhoids: Secondary | ICD-10-CM

## 2021-11-14 DIAGNOSIS — F419 Anxiety disorder, unspecified: Secondary | ICD-10-CM | POA: Diagnosis not present

## 2021-11-14 DIAGNOSIS — F32A Depression, unspecified: Secondary | ICD-10-CM

## 2021-11-14 DIAGNOSIS — K219 Gastro-esophageal reflux disease without esophagitis: Secondary | ICD-10-CM | POA: Insufficient documentation

## 2021-11-14 MED ORDER — HYDROCORTISONE (PERIANAL) 2.5 % EX CREA: 1.0000 "application " | TOPICAL_CREAM | Freq: Two times a day (BID) | CUTANEOUS | 0 refills | Status: DC

## 2021-11-14 NOTE — Progress Notes (Signed)
Kristen [name] [address] [place] [address] Dept: [phone] Dept Fax: [phone]  Transfer of Care Office Visit  Subjective:    Patient ID: Kristen [name], female    DOB: [birth year], 40 y.o..   MRN: [id number]  Chief Complaint  Patient presents with   [place] care.  C/o having hemorrhoids x 6 months.  She has been using Miralax and hemorrhoid cream.  Wants checked for diabetes.      History of Present Illness:  Patient is in today to establish care. Kristen [name] was born in [place], [place]. She has always lived there. She did attend [employer], studying business and accounting. However, she is currently a homemaker. She has been married for 20 years. She has a 75 yo son and a 22 yo daughter. She denies any tobacco or drug use. She rarely drinks alcohol.  Kristen [name] has a history of a seizure disorder. She is unsure about how long these have been occurring, but notes they may have been the result of a fall. She notes she used to see a neurologist, but she did not like taking the seizure meds and stopped these. She says it has been more than a year since her last "major" seizure. She has had a "minor" seizure about a month ago. She notes that she remains lucid during the minor seizures.  Kristen [name] has a history of interstitial cystitis. She states she avoids spicy foods. She otherwise tolerates this.   Kristen [name] has a history of anxiety and depression. She is currently managed on Lexapro. She says that the people she is around feel that she has done better on the medication.  Kristen [name] has a history of hyperlipidemia. She notes that she was never told that there was anything concerning about this. Additionally, she previously had a blood sugar of 114 on one occasional. She has concerns about possible diabetes, as her mother has diabetes.  Kristen [name] notes a history of GERD. She manages this with  omeprazole.  Kristen [name] has a history of hemorrhoids. She notes these are swollen and irritate at times. She has been using an OTC hemorrhoid cream, but feels this is not resolving the issue. She is using a laxative, possibly fiber, to also help with this.  Past Medical History: Patient Active Problem List   Diagnosis Date Noted   Hyperlipidemia 11/22/2020   Seizure disorder ([place]) 02/19/2016   Interstitial cystitis 11/23/2014   PMS (premenstrual syndrome) 11/23/2014   Pelvic pain in female 11/23/2014   History of suicide attempt 11/23/2014   Anxiety and depression 11/23/2014    Past Surgical History:  Procedure Laterality Date   ABDOMINAL HYSTERECTOMY     EXPLORATORY LAPAROTOMY      Family History  Problem Relation Age of Onset   Cancer Mother        Thyroid   Hyperlipidemia Mother    Hypertension Mother    Diabetes Mother    Cancer Maternal Grandmother        Breast, kidney   Mental illness Maternal Grandmother    Heart disease Paternal Grandfather     Outpatient Medications Prior to Visit  Medication Sig Dispense Refill   escitalopram (LEXAPRO) 10 MG tablet Take 1 tablet (10 mg total) by mouth daily. 90 tablet 3   Multiple Vitamin (MULTIVITAMIN) tablet Take 1 tablet by mouth daily.     omeprazole (PRILOSEC OTC) 20 MG tablet Take 20 mg by  mouth daily.     diphenhydrAMINE (BENADRYL) 25 MG tablet Take 25 mg by mouth every 6 (six) hours as needed. (Patient not taking: Reported on 12/13/2020)     No facility-administered medications prior to visit.   Allergies  Allergen Reactions   Amitriptyline Other (See Comments)    Daytime drowsiness   Penicillins Rash    rash    Objective:   Today's Vitals   11/14/21 1105  BP: 118/76  Pulse: (!) 103  Temp: (!) 97.4 F (36.3 C)  TempSrc: Temporal  SpO2: 98%  Weight: 206 lb 3.2 oz (93.5 kg)  Height: 5\' 2"  (1.575 m)   Body mass index is 37.71 kg/m.   General: Well developed, well nourished. No acute distress. Psych:  Alert and oriented. Normal mood and affect.  Health Maintenance Due  Topic Date Due   Hepatitis C Screening  Never done   TETANUS/TDAP  Never done   PAP SMEAR-Modifier  Never done   COVID-19 Vaccine (3 - Booster for Pfizer series) 05/20/2020   INFLUENZA VACCINE  Never done   Lab Results Last metabolic panel Lab Results  Component Value Date   GLUCOSE 95 11/09/2020   NA 140 11/09/2020   K 4.8 11/09/2020   CL 100 11/09/2020   CO2 27 11/09/2020   BUN 11 11/09/2020   CREATININE 0.77 11/09/2020   GFRNONAA >60 08/15/2013   CALCIUM 9.4 11/09/2020   PROT 7.7 02/08/2016   ALBUMIN 4.5 02/08/2016   BILITOT 0.4 02/08/2016   ALKPHOS 76 02/08/2016   AST 17 11/09/2020   ALT 26 11/09/2020   ANIONGAP 8 08/15/2013   Last lipids Lab Results  Component Value Date   CHOL 247 (H) 11/09/2020   HDL 42 (L) 11/09/2020   LDLCALC 159 (H) 11/09/2020   TRIG 279 (H) 11/09/2020   CHOLHDL 5.9 (H) 11/09/2020   Assessment & Plan:   1. Mixed hyperlipidemia I would like to repeat Kristen [name]'s lipids. Based on her prior values, her AHA/ACC CV Risk is 1.7% (low) risk for a major CV event in the next 10 years. There would not have been an indication for medical management of her lipids.  - Lipid panel; Future  2. Anxiety and depression Appears stable on Lexapro.  3. Interstitial cystitis Managing with dietary modifications currently.  4. Seizure disorder [facility]) I reviewed prior neurology notes by Dr. Melrose Nakayama. She last saw him 6 years ago. At that point, she was being managed on Keppra. I would be concerned for her risk for status epilepticus or accidental injury during her recurrent seizures. Kristen [name] strikes me as child-like and may have some developmental issues. I will try to broach reassessment of her seizures in the future.  5. Screening for diabetes mellitus (DM) I reassured her that prior testing has not shown any indication of diabetes.  - Hemoglobin A1c; Future  Kristen Salter, MD

## 2021-12-03 ENCOUNTER — Other Ambulatory Visit (INDEPENDENT_AMBULATORY_CARE_PROVIDER_SITE_OTHER): Payer: 59

## 2021-12-03 ENCOUNTER — Other Ambulatory Visit: Payer: Self-pay

## 2021-12-03 ENCOUNTER — Encounter: Payer: Self-pay | Admitting: Family Medicine

## 2021-12-03 DIAGNOSIS — Z131 Encounter for screening for diabetes mellitus: Secondary | ICD-10-CM | POA: Diagnosis not present

## 2021-12-03 DIAGNOSIS — E782 Mixed hyperlipidemia: Secondary | ICD-10-CM

## 2021-12-03 DIAGNOSIS — R739 Hyperglycemia, unspecified: Secondary | ICD-10-CM

## 2021-12-03 DIAGNOSIS — E119 Type 2 diabetes mellitus without complications: Secondary | ICD-10-CM | POA: Insufficient documentation

## 2021-12-03 LAB — LIPID PANEL
Cholesterol: 221 mg/dL — ABNORMAL HIGH (ref 0–200)
HDL: 40.4 mg/dL (ref >39.00)
NonHDL: 180.96
Total CHOL/HDL Ratio: 5
Triglycerides: 297 mg/dL — ABNORMAL HIGH (ref 0.0–149.0)
VLDL: 59.4 mg/dL — ABNORMAL HIGH (ref 0.0–40.0)

## 2021-12-03 LAB — HEMOGLOBIN A1C: Hgb A1c MFr Bld: 7 % — ABNORMAL HIGH (ref 4.6–6.5)

## 2021-12-03 LAB — LDL CHOLESTEROL, DIRECT: Direct LDL: 155 mg/dL

## 2021-12-03 NOTE — Addendum Note (Signed)
Addended by: Haydee Salter on: 12/03/2021 11:12 AM   Modules accepted: Orders

## 2021-12-11 ENCOUNTER — Other Ambulatory Visit: Payer: Self-pay

## 2021-12-11 ENCOUNTER — Other Ambulatory Visit (INDEPENDENT_AMBULATORY_CARE_PROVIDER_SITE_OTHER): Payer: 59

## 2021-12-11 DIAGNOSIS — Z131 Encounter for screening for diabetes mellitus: Secondary | ICD-10-CM | POA: Diagnosis not present

## 2021-12-11 DIAGNOSIS — R739 Hyperglycemia, unspecified: Secondary | ICD-10-CM

## 2021-12-11 LAB — GLUCOSE, RANDOM: Glucose, Bld: 110 mg/dL — ABNORMAL HIGH (ref 70–99)

## 2021-12-17 ENCOUNTER — Other Ambulatory Visit: Payer: Self-pay

## 2021-12-17 ENCOUNTER — Encounter: Payer: Self-pay | Admitting: Family Medicine

## 2021-12-17 ENCOUNTER — Ambulatory Visit (INDEPENDENT_AMBULATORY_CARE_PROVIDER_SITE_OTHER): Payer: 59 | Admitting: Family Medicine

## 2021-12-17 VITALS — BP 110/68 | HR 97 | Temp 97.1°F | Ht 62.0 in | Wt 208.0 lb

## 2021-12-17 DIAGNOSIS — L720 Epidermal cyst: Secondary | ICD-10-CM

## 2021-12-17 DIAGNOSIS — L909 Atrophic disorder of skin, unspecified: Secondary | ICD-10-CM

## 2021-12-17 DIAGNOSIS — R7303 Prediabetes: Secondary | ICD-10-CM | POA: Diagnosis not present

## 2021-12-17 MED ORDER — METFORMIN HCL 500 MG PO TABS: 500.0000 mg | ORAL_TABLET | Freq: Every day | ORAL | 3 refills | Status: DC

## 2021-12-17 NOTE — Progress Notes (Signed)
[facility] [address] [place] [address] Dept: [phone] Dept Fax: [phone]  Office Visit  Subjective:    Patient ID: Kristen [name], female    DOB: 12-11-1980, 41 y.o..   MRN: [id number]  Chief Complaint  Patient presents with   Numbness    Hand numbness, knots on scalp would like to discuss labs.     History of Present Illness:  Patient is in today for evaluation of several lumps on her scalp. She notes she has had issues with this for more than 20 years. She notes that her husband has opened one of these up and that he removed a central nodule/core. The lumps have been getting larger with time. She notes her mother had a similar condition.  [name] notes she had previously been having issues with her hands feeling numb. However, she notes this has now resolved. She is not sure if this was significant.  [name] had recent lab work which is equivocal for whether she has diabetes. She feels this is in part to recent weight gain. She notes she has a stationary bike at home, but has not been using it. She notes the challenges of routine exercise. She also has issues with eating sweats. She notes she has to avoid aspartame-containing food or drink as this makes her jittery.  Past Medical History: Patient Active Problem List   Diagnosis Date Noted   Hyperglycemia 12/03/2021   GERD (gastroesophageal reflux disease) 11/14/2021   Hyperlipidemia 11/22/2020   Seizure disorder ([place]) 02/19/2016   Interstitial cystitis 11/23/2014   PMS (premenstrual syndrome) 11/23/2014   Pelvic pain in female 11/23/2014   History of suicide attempt 11/23/2014   Anxiety and depression 11/23/2014   Past Surgical History:  Procedure Laterality Date   ABDOMINAL HYSTERECTOMY     EXPLORATORY LAPAROTOMY     Family History  Problem Relation Age of Onset   Cancer Mother        Thyroid   Hyperlipidemia Mother    Hypertension Mother     Diabetes Mother    Cancer Maternal Grandmother        Breast, kidney   Mental illness Maternal Grandmother    Heart disease Paternal Grandfather    Outpatient Medications Prior to Visit  Medication Sig Dispense Refill   escitalopram (LEXAPRO) 10 MG tablet Take 1 tablet (10 mg total) by mouth daily. 90 tablet 3   hydrocortisone (ANUSOL-HC) 2.5 % rectal cream Place 1 application rectally 2 (two) times daily. 30 g 0   Multiple Vitamin (MULTIVITAMIN) tablet Take 1 tablet by mouth daily.     omeprazole (PRILOSEC OTC) 20 MG tablet Take 20 mg by mouth daily.     No facility-administered medications prior to visit.   Allergies  Allergen Reactions   Amitriptyline Other (See Comments)    Daytime drowsiness   Penicillins Rash    rash    Objective:   Today's Vitals   12/17/21 1457  BP: 110/68  Pulse: 97  Temp: (!) 97.1 F (36.2 C)  TempSrc: Temporal  SpO2: 97%  Weight: 208 lb (94.3 kg)  Height: 5\' 2"  (1.575 m)   Body mass index is 38.04 kg/m.   General: Well developed, well nourished. No acute distress. Skin: Warm and dry. There are several round, raised lesions of the scalp, which do appear to have a   cystic core. The larger lesion, over the left parietal area is about 3.5 cm in diameter and quite raised.  There is a lesion over the anterior apex of the scalp that has a central pit (noted by patient to be the   lesion her husband opened). There is also a round lesion on her left upper back that is ~ 5 cm in size. It   is mildly hyperpigmented and is sunken in the skin Psych: Alert and oriented. Normal mood and affect.  Health Maintenance Due  Topic Date Due   Hepatitis C Screening  Never done   TETANUS/TDAP  Never done   Lab Results Last lipids Lab Results  Component Value Date   CHOL 221 (H) 12/03/2021   HDL 40.40 12/03/2021   LDLCALC 159 (H) 11/09/2020   LDLDIRECT 155.0 12/03/2021   TRIG 297.0 (H) 12/03/2021   CHOLHDL 5 12/03/2021   Last hemoglobin A1c Lab  Results  Component Value Date   HGBA1C 7.0 (H) 12/03/2021   BMP Latest Ref Rng & Units 12/11/2021 11/09/2020 02/08/2016  Glucose 70 - 99 mg/dL 110(H) 95 114(H)  BUN 7 - 25 mg/dL - 11 10  Creatinine 0.50 - 1.10 mg/dL - 0.77 0.79  BUN/Creat Ratio 6 - 22 (calc) - NOT APPLICABLE -  Sodium 972 - 146 mmol/L - 140 139  Potassium 3.5 - 5.3 mmol/L - 4.8 4.1  Chloride 98 - 110 mmol/L - 100 103  CO2 20 - 32 mmol/L - 27 30  Calcium 8.6 - 10.2 mg/dL - 9.4 9.4     Assessment & Plan:   1. Epidermoid cyst of skin of scalp Multiple apparent epidermoid cysts. I will refer her to dermatology for evaluation and potential excision.  - Ambulatory referral to Dermatology  2. Atrophic spots of skin The lesion on the back appears like there is some subcutaneous atrophy underneath. I am uncertain as to its exact nature, so will also ask dermatology to assess.  - Ambulatory referral to Dermatology  3. Prediabetes [name] ha an elevated A1c, but her fasting glucose is non-diagnostic for diabetes. We discussed repeating her A1c in 3 months for confirmation. In the meantime, I will refer her for diabetes teaching and start her on metformin. We discussed that weight loss and regular exercise are likely her best approach to avoiding developing frank diabetes.  - metFORMIN (GLUCOPHAGE) 500 MG tablet; Take 1 tablet (500 mg total) by mouth daily with breakfast.  Dispense: 90 tablet; Refill: 3 - Ambulatory referral to diabetic education  Haydee Salter, MD

## 2021-12-19 ENCOUNTER — Telehealth: Payer: Self-pay | Admitting: Dermatology

## 2021-12-19 NOTE — Telephone Encounter (Signed)
Patient is calling for a referral appointment from Arlester Marker, M.D.  Patient is scheduled for 07/16/2022 at 3:00 with Lavonna Monarch, M.D.

## 2021-12-20 NOTE — Telephone Encounter (Signed)
Referral attached to appointment

## 2022-01-10 ENCOUNTER — Other Ambulatory Visit: Payer: Self-pay

## 2022-01-10 DIAGNOSIS — F419 Anxiety disorder, unspecified: Secondary | ICD-10-CM

## 2022-01-10 DIAGNOSIS — F32A Depression, unspecified: Secondary | ICD-10-CM

## 2022-01-10 MED ORDER — ESCITALOPRAM OXALATE 10 MG PO TABS: 10.0000 mg | ORAL_TABLET | Freq: Every day | ORAL | 0 refills | Status: DC

## 2022-03-09 ENCOUNTER — Other Ambulatory Visit: Payer: Self-pay | Admitting: Family Medicine

## 2022-03-09 DIAGNOSIS — F32A Depression, unspecified: Secondary | ICD-10-CM

## 2022-03-11 ENCOUNTER — Telehealth: Payer: Self-pay | Admitting: Family Medicine

## 2022-03-11 NOTE — Telephone Encounter (Addendum)
Rx sent to the pharmacy on 03/10/22. Lft VM that RX was sent to the pharmacy . Dm/cma ? ?

## 2022-03-11 NOTE — Telephone Encounter (Signed)
Called and spoke to [facility], patient last picked up a #90 on 01/09/22, she should have enough till 04/09/22.  Advised  them that we would call patient to speak with her.   ?Lft VM to rtn call. Dm/cma ? ?

## 2022-03-11 NOTE — Telephone Encounter (Signed)
Pharmacist from [facility] is checking to make sure Dr. Gena Fray knows the pt should have a enough Lexapro to last her until 04/09/22 from her last refill. The pt was screaming and hung up on pharmacist. She just wants to make sure this is OK, before she refills it.   Please advise pharmacist at Phone:  [phone]   at [facility] ?

## 2022-03-11 NOTE — Telephone Encounter (Signed)
Pt needs her escitalopram refilled. She is almost out. ?

## 2022-03-12 NOTE — Telephone Encounter (Signed)
Pt is aware, she forgot it came in separate bottles. She said the bottles weren't listed as 1of3, 2of3, and 3of3. She found her last bottle ?

## 2022-03-18 ENCOUNTER — Encounter: Payer: Self-pay | Admitting: Family Medicine

## 2022-03-18 ENCOUNTER — Ambulatory Visit (INDEPENDENT_AMBULATORY_CARE_PROVIDER_SITE_OTHER): Payer: 59 | Admitting: Family Medicine

## 2022-03-18 VITALS — BP 110/70 | HR 100 | Temp 97.8°F | Ht 62.0 in | Wt 204.2 lb

## 2022-03-18 DIAGNOSIS — R7303 Prediabetes: Secondary | ICD-10-CM

## 2022-03-18 NOTE — Progress Notes (Signed)
?[facility] ?[place] ?[place] [address] ?Dept: [phone] ?Dept Fax: [phone] ? ?Chronic Care Office Visit ? ?Subjective:  ? ? Patient ID: Kristen [name], female    DOB: [birth year], 41 y.o..   MRN: [id number] ? ?Chief Complaint  ?Patient presents with  ? Follow-up  ?  3 month follow up on Diabetes, concern about metallic taste in moth numbness in arms, hands and feet have come back.   ? ? ?History of Present Illness: ? ?Patient is in today for reassessment of chronic medical issues. Kristen [name] had an elevated A1c (7.0%) in January. Her blood sugar at that time was 110, which was not diagnostic for diabetes. We did decide to start her on metformin. She is taking this daily without apparent issues. I had referred her for diabetes teaching, but she notes her insurance would not cover this and she could not afford to go. ? ?Past Medical History: ?Patient Active Problem List  ? Diagnosis Date Noted  ? Hyperglycemia 12/03/2021  ? GERD (gastroesophageal reflux disease) 11/14/2021  ? Hyperlipidemia 11/22/2020  ? Seizure disorder ([place]) 02/19/2016  ? Interstitial cystitis 11/23/2014  ? PMS (premenstrual syndrome) 11/23/2014  ? Pelvic pain in female 11/23/2014  ? History of suicide attempt 11/23/2014  ? Anxiety and depression 11/23/2014  ? ?Past Surgical History:  ?Procedure Laterality Date  ? ABDOMINAL HYSTERECTOMY    ? EXPLORATORY LAPAROTOMY    ? ?Family History  ?Problem Relation Age of Onset  ? Cancer Mother   ?     Thyroid  ? Hyperlipidemia Mother   ? Hypertension Mother   ? Diabetes Mother   ? Cancer Maternal Grandmother   ?     Breast, kidney  ? Mental illness Maternal Grandmother   ? Heart disease Paternal Grandfather   ? ?Outpatient Medications Prior to Visit  ?Medication Sig Dispense Refill  ? escitalopram (LEXAPRO) 10 MG tablet Take 1 tablet by mouth once daily 90 tablet 3  ? hydrocortisone (ANUSOL-HC) 2.5 % rectal cream Place 1 application rectally  2 (two) times daily. 30 g 0  ? metFORMIN (GLUCOPHAGE) 500 MG tablet Take 1 tablet (500 mg total) by mouth daily with breakfast. 90 tablet 3  ? Multiple Vitamin (MULTIVITAMIN) tablet Take 1 tablet by mouth daily.    ? omeprazole (PRILOSEC OTC) 20 MG tablet Take 20 mg by mouth daily.    ? ?No facility-administered medications prior to visit.  ? ?Allergies  ?Allergen Reactions  ? Amitriptyline Other (See Comments)  ?  Daytime drowsiness  ? Penicillins Rash  ?  rash ?  ? ?Objective:  ? ?Today's Vitals  ? 03/18/22 1501  ?BP: 110/70  ?Pulse: 100  ?Temp: 97.8 ?F (36.6 ?C)  ?TempSrc: Temporal  ?SpO2: 95%  ?Weight: 204 lb 3.2 oz (92.6 kg)  ?Height: '5\' 2"'$  (1.575 m)  ? ?Body mass index is 37.35 kg/m?.  ? ?General: Well developed, well nourished. No acute distress. ?Psych: Alert and oriented. Normal mood and affect. ? ?Health Maintenance Due  ?Topic Date Due  ? Hepatitis C Screening  Never done  ?   ?Assessment & Plan:  ? ?1. Prediabetes ?We will recheck Kristen [name]'s A1c and glucose today to determine if she does in fact have Type 2 diabetes. She will continue to take metformin 500 mg daily. I will plan to see her back within 3 months for ongoing monitoring. ? ?- Glucose, random ?- Hemoglobin A1c ? ?Return in about 3 months (around 06/18/2022) for  Reassessment.  ? ?Haydee Salter, MD ?

## 2022-03-19 ENCOUNTER — Encounter: Payer: Self-pay | Admitting: Family Medicine

## 2022-03-19 LAB — GLUCOSE, RANDOM: Glucose, Bld: 145 mg/dL — ABNORMAL HIGH (ref 70–99)

## 2022-03-19 LAB — HEMOGLOBIN A1C: Hgb A1c MFr Bld: 6.5 % (ref 4.6–6.5)

## 2022-04-11 ENCOUNTER — Encounter: Payer: Self-pay | Admitting: Obstetrics & Gynecology

## 2022-04-11 ENCOUNTER — Other Ambulatory Visit: Payer: Self-pay | Admitting: Obstetrics & Gynecology

## 2022-04-11 ENCOUNTER — Other Ambulatory Visit (HOSPITAL_COMMUNITY)
Admission: RE | Admit: 2022-04-11 | Discharge: 2022-04-11 | Disposition: A | Discharge status: Home / Self Care | Payer: 59 | Source: Ambulatory Visit | Attending: Obstetrics & Gynecology | Admitting: Obstetrics & Gynecology

## 2022-04-11 ENCOUNTER — Ambulatory Visit (INDEPENDENT_AMBULATORY_CARE_PROVIDER_SITE_OTHER): Payer: 59 | Admitting: Obstetrics & Gynecology

## 2022-04-11 VITALS — BP 129/77 | HR 97 | Ht 62.0 in | Wt 203.2 lb

## 2022-04-11 DIAGNOSIS — Z1231 Encounter for screening mammogram for malignant neoplasm of breast: Secondary | ICD-10-CM

## 2022-04-11 DIAGNOSIS — L819 Disorder of pigmentation, unspecified: Secondary | ICD-10-CM

## 2022-04-11 DIAGNOSIS — N9089 Other specified noninflammatory disorders of vulva and perineum: Secondary | ICD-10-CM | POA: Diagnosis not present

## 2022-04-11 DIAGNOSIS — N760 Acute vaginitis: Secondary | ICD-10-CM | POA: Diagnosis present

## 2022-04-11 MED ORDER — LIDOCAINE HCL URETHRAL/MUCOSAL 2 % EX GEL: 1.0000 "application " | CUTANEOUS | 2 refills | Status: DC | PRN

## 2022-04-11 NOTE — Progress Notes (Signed)
GYNECOLOGY OFFICE VISIT NOTE  History:   Kristen [name] is a 41 y.o. [phone] here today to establish care and for  evaluation of vulva itching, vaginal and anal discoloration. Symptoms started 2 months ago.  No interventions done thus far. She does report a history of having lesions in her vulva frozen off a few years ago, her partner had similar lesions and she was told it was caused by a viral infection. She is not familiar with the terms 'genital warts' or condyloma'; unsure of what type of infection she had.   Patient had a long history of chronic pelvic pain resulting in a total laparoscopic hysterectomy and bilateral salpingectomy in 2015 at [facility]; benign pathology and no endometriosis seen.  No history of severe cervical dysplasia prior to hysterectomy.  She denies any current abnormal vaginal discharge, bleeding, pelvic pain or other concerns.    Past Medical History:  Diagnosis Date   Depression    Endometriosis    Fainting spell    Frequent headaches    GERD (gastroesophageal reflux disease)    Syncope     Past Surgical History:  Procedure Laterality Date   EXPLORATORY LAPAROTOMY     TOTAL LAPAROSCOPIC HYSTERECTOMY WITH SALPINGECTOMY Bilateral 12/31/2013   Done at [facility] for pelvic pain, bening pathology and no endometriosis seen    The following portions of the patient's history were reviewed and updated as appropriate: allergies, current medications, past family history, past medical history, past social history, past surgical history and problem list.   Health Maintenance:  Never had a mammogram.  Review of Systems:  Pertinent items noted in HPI and remainder of comprehensive ROS otherwise negative.  Physical Exam:  BP 129/77   Pulse 97   Ht '5\' 2"'$  (1.575 m)   Wt 203 lb 3.2 oz (92.2 kg)   BMI 37.17 kg/m  CONSTITUTIONAL: Well-developed, well-nourished female in no acute distress.  HEENT:  Normocephalic, atraumatic. External right and left ear normal. No scleral icterus.   NECK: Normal range of motion, supple, no masses noted on observation SKIN: No rash noted. Not diaphoretic. No erythema. No pallor. MUSCULOSKELETAL: Normal range of motion. No edema noted. NEUROLOGIC: Alert and oriented to person, place, and time. Normal muscle tone coordination. No cranial nerve deficit noted. PSYCHIATRIC: Normal mood and affect. Normal behavior. Normal judgment and thought content. CARDIOVASCULAR: Normal heart rate noted RESPIRATORY: Effort and breath sounds normal, no problems with respiration noted BREASTS: Symmetric in size. No masses, tenderness, skin changes, nipple drainage, or lymphadenopathy bilaterally. ABDOMEN: Soft, no distention noted.  No tenderness, rebound or guarding.  PELVIC:  See pictures below. Hypopigmented bilateral streaks on bilateral labia, no architectural distortion. Thickened and raised lesion noted in middle of posterior fourchette/perineum, tender to touch, but no tenderness, no drainage. Clear discharge noted in distal vaginal mucosa, sample obtained for testing. Exam done with RN as a chaperone.   VULVAR BIOPSY NOTE The indications for vulvar biopsy (rule out neoplasia, establish diagnosis) were reviewed.   Risks of the biopsy including pain, bleeding, infection, inadequate specimen, scarring and need for additional procedures  were discussed. The patient stated understanding and agreed to undergo procedure today. Consent was signed,  time out performed.  Chaperone was present during entire procedure. The patient's perineal area was prepped with Betadine. 1% lidocaine was injected into middle of the lesion. A 4-mm punch biopsy was done, biopsy tissue was picked up with sterile forceps and sterile scissors were used to excise the lesion.  Small bleeding was  noted and hemostasis was achieved using silver nitrate sticks and one stitch with 4-0 Vicryl.  The patient tolerated the procedure well.      Assessment and Plan:     1. Breast cancer screening  by mammogram Mammogram scheduled - MM 3D SCREEN BREAST BILATERAL; Future  2. Discoloration of skin of vulva 3. Vulvar lesion 4. Vulvar irritation Vulvar biopsy done, likely a chronic inflammation state vs condylomatous process.  Will follow up pathology results and manage accordingly. Lidocaine jelly prescribed for pain after biopsy, NSAIDs recommended for pain. Vulvovaginitis testing (including STI screen as requested by patient) done, will follow up results and manage accordingly.  Post-procedure instructions  (pelvic rest for one week) were given to the patient. The patient is to call with heavy bleeding, fever greater than 100.4, foul smelling vaginal discharge or other concerns. The patient will be contacted with results. - Surgical pathology from vulvar biopsy - lidocaine (XYLOCAINE) 2 % jelly; Apply 1 application. topically as needed. Dispense: 30 mL; Refill: 2 - Cervicovaginal ancillary only( [place])  Routine preventative health maintenance measures emphasized. Please refer to After Visit Summary for other counseling recommendations.   Return for any gynecologic concerns.    I spent 30 minutes dedicated to the care of this patient including pre-visit review of records, face to face time with the patient discussing her conditions and treatments and post visit orders.    Verita Schneiders, MD, [place] [facility] [employer], [place]

## 2022-04-16 ENCOUNTER — Other Ambulatory Visit: Payer: Self-pay | Admitting: Obstetrics and Gynecology

## 2022-04-16 LAB — CERVICOVAGINAL ANCILLARY ONLY
Bacterial Vaginitis (gardnerella): NEGATIVE
Candida Glabrata: NEGATIVE
Candida Vaginitis: POSITIVE — AB
Chlamydia: NEGATIVE
Comment: NEGATIVE
Comment: NEGATIVE
Comment: NEGATIVE
Comment: NEGATIVE
Comment: NEGATIVE
Comment: NORMAL
Neisseria Gonorrhea: NEGATIVE
Trichomonas: NEGATIVE

## 2022-04-16 MED ORDER — FLUCONAZOLE 150 MG PO TABS: 150.0000 mg | ORAL_TABLET | Freq: Once | ORAL | 0 refills | Status: AC

## 2022-04-16 MED ORDER — CLOBETASOL PROPIONATE 0.05 % EX OINT: TOPICAL_OINTMENT | CUTANEOUS | 5 refills | Status: DC

## 2022-05-09 ENCOUNTER — Ambulatory Visit
Admission: RE | Admit: 2022-05-09 | Discharge: 2022-05-09 | Disposition: A | Discharge status: Home / Self Care | Payer: 59 | Source: Ambulatory Visit | Attending: Obstetrics & Gynecology | Admitting: Obstetrics & Gynecology

## 2022-05-09 DIAGNOSIS — Z1231 Encounter for screening mammogram for malignant neoplasm of breast: Secondary | ICD-10-CM | POA: Diagnosis present

## 2022-05-12 ENCOUNTER — Encounter: Payer: Self-pay | Admitting: Family Medicine

## 2022-05-13 ENCOUNTER — Other Ambulatory Visit: Payer: Self-pay | Admitting: Obstetrics & Gynecology

## 2022-05-13 ENCOUNTER — Ambulatory Visit (INDEPENDENT_AMBULATORY_CARE_PROVIDER_SITE_OTHER): Payer: 59 | Admitting: Family Medicine

## 2022-05-13 VITALS — BP 120/82 | HR 87 | Temp 97.4°F | Ht 62.0 in | Wt 201.0 lb

## 2022-05-13 DIAGNOSIS — R921 Mammographic calcification found on diagnostic imaging of breast: Secondary | ICD-10-CM

## 2022-05-13 DIAGNOSIS — J069 Acute upper respiratory infection, unspecified: Secondary | ICD-10-CM | POA: Diagnosis not present

## 2022-05-13 DIAGNOSIS — R928 Other abnormal and inconclusive findings on diagnostic imaging of breast: Secondary | ICD-10-CM

## 2022-05-16 ENCOUNTER — Encounter: Payer: Self-pay | Admitting: Family Medicine

## 2022-05-16 ENCOUNTER — Telehealth: Payer: Self-pay | Admitting: Family Medicine

## 2022-05-16 ENCOUNTER — Ambulatory Visit (INDEPENDENT_AMBULATORY_CARE_PROVIDER_SITE_OTHER): Payer: 59 | Admitting: Family Medicine

## 2022-05-16 VITALS — BP 134/82 | HR 80 | Temp 97.2°F | Wt 200.2 lb

## 2022-05-16 DIAGNOSIS — H1033 Unspecified acute conjunctivitis, bilateral: Secondary | ICD-10-CM | POA: Diagnosis not present

## 2022-05-16 DIAGNOSIS — J029 Acute pharyngitis, unspecified: Secondary | ICD-10-CM

## 2022-05-16 LAB — POCT RAPID STREP A (OFFICE): Rapid Strep A Screen: NEGATIVE

## 2022-05-16 MED ORDER — CIPROFLOXACIN HCL 0.3 % OP SOLN: 1.0000 [drp] | OPHTHALMIC | 0 refills | Status: AC

## 2022-05-16 MED ORDER — LIDOCAINE VISCOUS HCL 2 % MT SOLN: 5.0000 mL | Freq: Three times a day (TID) | OROMUCOSAL | 0 refills | Status: AC | PRN

## 2022-05-16 MED ORDER — PREDNISONE 20 MG PO TABS: 20.0000 mg | ORAL_TABLET | Freq: Every day | ORAL | 0 refills | Status: AC

## 2022-05-16 NOTE — Assessment & Plan Note (Addendum)
Likely viral Strep negative Persistent systems despite supportive care Trial short course prednisone 20 mg x 3 days Magic mouthwash as needed May continue Tylenol and ibuprofen and other supportive measures as previously instructed Follow-up as needed

## 2022-05-16 NOTE — Patient Instructions (Signed)
Use eyedrops for possible infection Use prednisone and Magic mouthwash for sore throat

## 2022-05-16 NOTE — Assessment & Plan Note (Signed)
This is developed over the past few days.  Per history, bilateral purulence in both eyes. Likely viral given constellation of systems, however cannot rule out superinfection Trial of ciprofloxacin eyedrops as prescribed

## 2022-05-16 NOTE — Telephone Encounter (Signed)
[place] [phone] is calling to clarify the directions for pt's  ciprofloxacin (CILOXAN) 0.3 % ophthalmic solution [[id number]].  Please advise Phone:  [phone]

## 2022-05-16 NOTE — Progress Notes (Signed)
   Kristen [name] is a 41 y.o. female who presents today for an office visit.  Assessment/Plan:   Problem List Items Addressed This Visit       Respiratory   Pharyngitis - Primary    Likely viral Strep negative Persistent systems despite supportive care Trial short course prednisone 20 mg x 3 days Magic mouthwash as needed May continue Tylenol and ibuprofen and other supportive measures as previously instructed Follow-up as needed      Relevant Medications   predniSONE (DELTASONE) 20 MG tablet   magic mouthwash (lidocaine, diphenhydrAMINE, alum & mag hydroxide) suspension   Other Relevant Orders   POCT rapid strep A (Completed)     Other   Acute conjunctivitis of both eyes    This is developed over the past few days.  Per history, bilateral purulence in both eyes. Likely viral given constellation of systems, however cannot rule out superinfection Trial of ciprofloxacin eyedrops as prescribed      Relevant Medications   ciprofloxacin (CILOXAN) 0.3 % ophthalmic solution       Subjective:  HPI:  Kristen [name] is a 42 y.o. female who has Interstitial cystitis; PMS (premenstrual syndrome); Pelvic pain in female; History of suicide attempt; Anxiety and depression; Seizure disorder ([place]); Hyperlipidemia; GERD (gastroesophageal reflux disease); Type 2 diabetes mellitus ([place]); Acute conjunctivitis of both eyes; and Pharyngitis on their problem list..   She  has a past medical history of Depression, Endometriosis, Fainting spell, Frequent headaches, GERD (gastroesophageal reflux disease), and Syncope..   She presents with chief complaint of Eye Problem (Patient states that her eyes are red with discharge and she feels like she is swallowing razor blades x 2 weeks. Declined strep test.) .   Sore Throat: Patient complains of sore throat. Associated symptoms include chest pain during cough, dry cough, and red, "gunky" eyes .Onset of symptoms was 2 weeks ago, sore throat is about  the same or mildly better, eyes have developed redness and irritation and purulence over the time.  She is able to eat and drink.  Sore throat is improved with honey, ibuprofen. She is drinking plenty of fluids.   Patient was seen on 06/29.  At that time she was diagnosed with URI and counseled on supportive care.        Objective:  Physical Exam: BP 134/82 (BP Location: Left Arm, Patient Position: Sitting, Cuff Size: Large)   Pulse 80   Temp (!) 97.2 F (36.2 C) (Temporal)   Wt 200 lb 3.2 oz (90.8 kg)   SpO2 98%   BMI 36.62 kg/m    General: No acute distress. Awake and conversant.  Eyes: Injected conjunctiva, anicteric. Round symmetric pupils.  ENT: Hearing grossly intact. No nasal discharge.  Erythematous posterior oropharynx, mild exudate, tonsils +1 Neck: Neck is supple. No masses or thyromegaly.  Respiratory: Respirations are non-labored.  CTA B skin: Warm. No rashes or ulcers.  Psych: Alert and oriented. Cooperative, Appropriate mood and affect, Normal judgment.  CV: No cyanosis or JVD, normal S1-S2, no M MM MSK: Normal ambulation. No clubbing  Neuro: Sensation and CN II-XII grossly normal.        Alesia Banda, MD, MS

## 2022-05-17 ENCOUNTER — Encounter: Payer: Self-pay | Admitting: Family Medicine

## 2022-05-17 ENCOUNTER — Telehealth: Payer: Self-pay

## 2022-05-17 NOTE — Telephone Encounter (Signed)
Patient saw Calla Kicks yesterday afternoon and the medication for the [place] Mouthwash will need to be sent to the [facility] due to [place] doesn't do compound RX's. The Lidocaine is on back order at both [facility] & [facility].   Please review and advise. Thanks. Dm/cma

## 2022-05-17 NOTE — Telephone Encounter (Signed)
Spoke to Dr Gena Fray and was advised to use the tamper directions. They will get ready for her  Dm/cma

## 2022-05-20 NOTE — Telephone Encounter (Signed)
Pharmacy notified on Friday. Dm/cma

## 2022-05-31 ENCOUNTER — Ambulatory Visit
Admission: RE | Admit: 2022-05-31 | Discharge: 2022-05-31 | Disposition: A | Discharge status: Home / Self Care | Payer: 59 | Source: Ambulatory Visit | Attending: Obstetrics & Gynecology | Admitting: Obstetrics & Gynecology

## 2022-05-31 DIAGNOSIS — R921 Mammographic calcification found on diagnostic imaging of breast: Secondary | ICD-10-CM

## 2022-05-31 DIAGNOSIS — R928 Other abnormal and inconclusive findings on diagnostic imaging of breast: Secondary | ICD-10-CM | POA: Diagnosis present

## 2022-06-18 ENCOUNTER — Ambulatory Visit (INDEPENDENT_AMBULATORY_CARE_PROVIDER_SITE_OTHER): Payer: 59 | Admitting: Family Medicine

## 2022-06-18 ENCOUNTER — Encounter: Payer: Self-pay | Admitting: Family Medicine

## 2022-06-18 VITALS — BP 128/68 | HR 96 | Temp 97.8°F | Ht 62.0 in | Wt 199.6 lb

## 2022-06-18 DIAGNOSIS — E782 Mixed hyperlipidemia: Secondary | ICD-10-CM | POA: Diagnosis not present

## 2022-06-18 DIAGNOSIS — E119 Type 2 diabetes mellitus without complications: Secondary | ICD-10-CM | POA: Diagnosis not present

## 2022-06-18 DIAGNOSIS — D234 Other benign neoplasm of skin of scalp and neck: Secondary | ICD-10-CM | POA: Diagnosis not present

## 2022-06-18 LAB — URINALYSIS, ROUTINE W REFLEX MICROSCOPIC
Bilirubin Urine: NEGATIVE
Hgb urine dipstick: NEGATIVE
Ketones, ur: NEGATIVE
Leukocytes,Ua: NEGATIVE
Nitrite: NEGATIVE
Specific Gravity, Urine: >=1.03 — AB (ref 1.000–1.030)
Total Protein, Urine: NEGATIVE
Urine Glucose: NEGATIVE
Urobilinogen, UA: 2 — AB (ref 0.0–1.0)
pH: 6 (ref 5.0–8.0)

## 2022-06-18 LAB — BASIC METABOLIC PANEL
BUN: 13 mg/dL (ref 6–23)
CO2: 27 mEq/L (ref 19–32)
Calcium: 9.6 mg/dL (ref 8.4–10.5)
Chloride: 99 mEq/L (ref 96–112)
Creatinine, Ser: 0.8 mg/dL (ref 0.40–1.20)
GFR: 91.45 mL/min (ref >60.00)
Glucose, Bld: 103 mg/dL — ABNORMAL HIGH (ref 70–99)
Potassium: 4.1 mEq/L (ref 3.5–5.1)
Sodium: 135 mEq/L (ref 135–145)

## 2022-06-18 LAB — LDL CHOLESTEROL, DIRECT: Direct LDL: 166 mg/dL

## 2022-06-18 LAB — MICROALBUMIN / CREATININE URINE RATIO
Creatinine,U: 136.1 mg/dL
Microalb Creat Ratio: 0.6 mg/g (ref 0.0–30.0)
Microalb, Ur: 0.7 mg/dL (ref 0.0–1.9)

## 2022-06-18 LAB — LIPID PANEL
Cholesterol: 241 mg/dL — ABNORMAL HIGH (ref 0–200)
HDL: 40.5 mg/dL (ref >39.00)
NonHDL: 200.81
Total CHOL/HDL Ratio: 6
Triglycerides: 309 mg/dL — ABNORMAL HIGH (ref 0.0–149.0)
VLDL: 61.8 mg/dL — ABNORMAL HIGH (ref 0.0–40.0)

## 2022-06-18 LAB — HEMOGLOBIN A1C: Hgb A1c MFr Bld: 6.5 % (ref 4.6–6.5)

## 2022-06-18 NOTE — Progress Notes (Signed)
[facility] [address] [place] [address] Dept: [phone] Dept Fax: [phone]  Chronic Care Office Visit  Subjective:    Patient ID: Kristen [name], female    DOB: [birth year], 41 y.o..   MRN: [id number]  Chief Complaint  Patient presents with   Follow-up    3 month f/u. No concerns.  Wants referral to dermatology.     History of Present Illness:  Patient is in today for reassessment of chronic medical issues.  [name] has a history of Type 2 diabetes. This was confirmed on blood testing after her last visit. [name] was already on metformin 500 mg daily.   [name] has a history of hyperlipidemia. She has not been on medication for this.  [name] has multiple cysts of the scalp, which have been an issue for more than 20 years. She notes that her husband has opened one of these up and that he removed a central nodule/core of white material. The lumps have been getting larger with time. She notes her mother had a similar condition. She was referred to dermatology, but states it will be many months before they can see her.  Past Medical History: Patient Active Problem List   Diagnosis Date Noted   Pharyngitis 05/16/2022   Type 2 diabetes mellitus ([place]) 12/03/2021   GERD (gastroesophageal reflux disease) 11/14/2021   Hyperlipidemia 11/22/2020   Seizure disorder ([place]) 02/19/2016   Interstitial cystitis 11/23/2014   PMS (premenstrual syndrome) 11/23/2014   Pelvic pain in female 11/23/2014   History of suicide attempt 11/23/2014   Anxiety and depression 11/23/2014   Past Surgical History:  Procedure Laterality Date   EXPLORATORY LAPAROTOMY     TOTAL LAPAROSCOPIC HYSTERECTOMY WITH SALPINGECTOMY Bilateral 12/31/2013   Done at [facility] for pelvic pain, benign pathology and no endometriosis seen   Family History  Problem Relation Age of Onset   Cancer Mother        Thyroid   Hyperlipidemia Mother    Hypertension  Mother    Diabetes Mother    Cancer Maternal Grandmother        Breast, kidney   Mental illness Maternal Grandmother    Heart disease Paternal Grandfather    Outpatient Medications Prior to Visit  Medication Sig Dispense Refill   clobetasol ointment (TEMOVATE) 0.05 % Apply to affected area every night for 4 weeks, then every other day for 4 weeks and then twice a week for 4 weeks or until resolution. 30 g 5   escitalopram (LEXAPRO) 10 MG tablet Take 1 tablet by mouth once daily 90 tablet 3   hydrocortisone (ANUSOL-HC) 2.5 % rectal cream Place 1 application rectally 2 (two) times daily. 30 g 0   lidocaine (XYLOCAINE) 2 % jelly Apply 1 application. topically as needed. 30 mL 2   metFORMIN (GLUCOPHAGE) 500 MG tablet Take 1 tablet (500 mg total) by mouth daily with breakfast. 90 tablet 3   Multiple Vitamin (MULTIVITAMIN) tablet Take 1 tablet by mouth daily.     omeprazole (PRILOSEC OTC) 20 MG tablet Take 20 mg by mouth daily.     No facility-administered medications prior to visit.   Allergies  Allergen Reactions   Amitriptyline Other (See Comments)    Daytime drowsiness   Penicillins Rash    rash      Objective:   Today's Vitals   06/18/22 1058  BP: 128/68  Pulse: 96  Temp: 97.8 F (36.6 C)  TempSrc: Temporal  SpO2:  96%  Weight: 199 lb 9.6 oz (90.5 kg)  Height: '5\' 2"'$  (1.575 m)   Body mass index is 36.51 kg/m.   General: Well developed, well nourished. No acute distress. Skin: There are several round, raised lesions of the scalp, which do appear to have a   cystic core. The larger lesion, over the left parietal area is about 3.5 cm in diameter and quite raised. Feet- Skin intact. No sign of maceration between toes. Nails are normal. Dorsalis pedis and posterior tibial artery   pulses are normal. 5.07 monofilament testing normal. Psych: Alert and oriented. Normal mood and affect.  Health Maintenance Due  Topic Date Due   OPHTHALMOLOGY EXAM  Never done   URINE  MICROALBUMIN  Never done   Hepatitis C Screening  Never done   Lab Results Component Ref Range & Units 3 mo ago 6 mo ago  Hgb A1c MFr Bld 4.6 - 6.5 % 6.5  7.0 High  CM       Assessment & Plan:   1. Type 2 diabetes mellitus without complication, without long-term current use of insulin ([place]) Ms. Difiore is managing well on metformin 500 mg daily. I will order annual DM labs today. Foot exam completed. I discussed the importance of an annual dilated eye exam and asked her to schedule this.  - Microalbumin / creatinine urine ratio - Basic metabolic panel - Hemoglobin A1c - Urinalysis, Routine w reflex microscopic  2. Mixed hyperlipidemia I will repeat lipids today. In light of her diabetes and age,s he should be on a statin. I will discuss this with her at her next visit.  - Lipid panel  3. Dermoid cyst of scalp As [name] is having difficulty with accessing care with a dermatologist, I will go ahead with a referral to general surgery. The main cyst is larger than I would want to attempt in the office. Surgery may be able to manage int he office or a surgical center.  - Ambulatory referral to General Surgery   Return in about 3 months (around 09/18/2022) for Reassessment.   Haydee Salter, MD

## 2022-06-24 LAB — HM DIABETES EYE EXAM

## 2022-07-05 ENCOUNTER — Encounter: Payer: Self-pay | Admitting: Obstetrics & Gynecology

## 2022-07-16 ENCOUNTER — Ambulatory Visit: Payer: 59 | Admitting: Dermatology

## 2022-08-22 ENCOUNTER — Encounter: Payer: Self-pay | Admitting: Family Medicine

## 2022-08-23 NOTE — Telephone Encounter (Signed)
Noted. Dm/cma  

## 2022-09-03 ENCOUNTER — Telehealth: Payer: Self-pay

## 2022-09-03 NOTE — Telephone Encounter (Signed)
Spoke to Dr Earma Reading w/ [facility] research group @ [phone]. she states that patient wants to participate in a research study involving the medication Nortriptyline, but since she is on Lexapro she wants to make sure that it would be okay and that it could be a placebo or low dose of Nortriptyline.   She is okay with waiting till you return on 09/16/22 for an answer.  Please review and advise.  Thanks.  Dm/cma

## 2022-09-16 DIAGNOSIS — L7211 Pilar cyst: Secondary | ICD-10-CM | POA: Diagnosis not present

## 2022-09-16 DIAGNOSIS — D225 Melanocytic nevi of trunk: Secondary | ICD-10-CM | POA: Diagnosis not present

## 2022-09-16 NOTE — Telephone Encounter (Signed)
Called Dr Earma Reading and advised that it was ok for her to be in the research. Dm/cma

## 2022-09-18 ENCOUNTER — Encounter: Payer: Self-pay | Admitting: Family Medicine

## 2022-09-18 ENCOUNTER — Ambulatory Visit: Payer: 59 | Admitting: Family Medicine

## 2022-09-18 VITALS — BP 118/74 | HR 90 | Temp 97.6°F | Ht 62.0 in | Wt 202.0 lb

## 2022-09-18 DIAGNOSIS — E119 Type 2 diabetes mellitus without complications: Secondary | ICD-10-CM | POA: Diagnosis not present

## 2022-09-18 DIAGNOSIS — D234 Other benign neoplasm of skin of scalp and neck: Secondary | ICD-10-CM | POA: Diagnosis not present

## 2022-09-18 DIAGNOSIS — E782 Mixed hyperlipidemia: Secondary | ICD-10-CM | POA: Diagnosis not present

## 2022-09-18 HISTORY — PX: CYST EXCISION: SHX5701

## 2022-09-18 MED ORDER — ATORVASTATIN CALCIUM 20 MG PO TABS: 20.0000 mg | ORAL_TABLET | Freq: Every day | ORAL | 3 refills | Status: DC

## 2022-09-18 NOTE — Progress Notes (Signed)
[facility] [address] [place] [address] Dept: [phone] Dept Fax: [phone]  Chronic Care Office Visit  Subjective:    Patient ID: Kristen [name], female    DOB: 1981-02-22, 41 y.o..   MRN: [id number]  Chief Complaint  Patient presents with   Follow-up    3 month f/u.  No concerns.     History of Present Illness:  Patient is in today for reassessment of chronic medical issues.  Kristen [name] has a history of Type 2 diabetes. This was confirmed on blood testing after her last visit. Kristen [name] was already on metformin 500 mg daily.    Kristen [name] has a history of hyperlipidemia. She has not been on medication for this.   Kristen [name] has multiple cysts of the scalp, which have been an issue for more than 20 years. She was recently able to get in to see a dermatologist in [place]. They have scheduled her to come in ofr surgical excision of these.  Past Medical History: Patient Active Problem List   Diagnosis Date Noted   Dermoid cyst of scalp 06/18/2022   Pharyngitis 05/16/2022   Type 2 diabetes mellitus ([place]) 12/03/2021   GERD (gastroesophageal reflux disease) 11/14/2021   Hyperlipidemia 11/22/2020   Seizure disorder ([place]) 02/19/2016   Interstitial cystitis 11/23/2014   PMS (premenstrual syndrome) 11/23/2014   Pelvic pain in female 11/23/2014   History of suicide attempt 11/23/2014   Anxiety and depression 11/23/2014   Past Surgical History:  Procedure Laterality Date   EXPLORATORY LAPAROTOMY     TOTAL LAPAROSCOPIC HYSTERECTOMY WITH SALPINGECTOMY Bilateral 12/31/2013   Done at [facility] for pelvic pain, benign pathology and no endometriosis seen   Family History  Problem Relation Age of Onset   Cancer Mother        Thyroid   Hyperlipidemia Mother    Hypertension Mother    Diabetes Mother    Cancer Maternal Grandmother        Breast, kidney   Mental illness Maternal Grandmother    Heart disease Paternal  Grandfather    Outpatient Medications Prior to Visit  Medication Sig Dispense Refill   clobetasol ointment (TEMOVATE) 0.05 % Apply to affected area every night for 4 weeks, then every other day for 4 weeks and then twice a week for 4 weeks or until resolution. 30 g 5   escitalopram (LEXAPRO) 10 MG tablet Take 1 tablet by mouth once daily 90 tablet 3   lidocaine (XYLOCAINE) 2 % jelly Apply 1 application. topically as needed. 30 mL 2   metFORMIN (GLUCOPHAGE) 500 MG tablet Take 1 tablet (500 mg total) by mouth daily with breakfast. 90 tablet 3   Multiple Vitamin (MULTIVITAMIN) tablet Take 1 tablet by mouth daily.     omeprazole (PRILOSEC OTC) 20 MG tablet Take 20 mg by mouth daily.     hydrocortisone (ANUSOL-HC) 2.5 % rectal cream Place 1 application rectally 2 (two) times daily. (Patient not taking: Reported on 09/18/2022) 30 g 0   No facility-administered medications prior to visit.   Allergies  Allergen Reactions   Amitriptyline Other (See Comments)    Daytime drowsiness   Penicillins Rash    rash     Objective:   Today's Vitals   09/18/22 1536  BP: 118/74  Pulse: 90  Temp: 97.6 F (36.4 C)  TempSrc: Temporal  SpO2: 97%  Weight: 202 lb (91.6 kg)  Height: '5\' 2"'$  (1.575 m)   Body mass index is  36.95 kg/m.   General: Well developed, well nourished. No acute distress. Psych: Alert and oriented. Normal mood and affect.  Health Maintenance Due  Topic Date Due   OPHTHALMOLOGY EXAM  Never done   Hepatitis C Screening  Never done   Lab Results Last hemoglobin A1c Lab Results  Component Value Date   HGBA1C 6.5 06/18/2022   Lab Results  Component Value Date   CHOL 241 (H) 06/18/2022   HDL 40.50 06/18/2022   LDLCALC 159 (H) 11/09/2020   LDLDIRECT 166.0 06/18/2022   TRIG 309.0 (H) 06/18/2022   CHOLHDL 6 06/18/2022   Assessment & Plan:   1. Type 2 diabetes mellitus without complication, without long-term current use of insulin ([facility]) We will recheck an A1c today. Plan  to continue metformin 500 mg daily. UTD on screenings.  - Glucose, random - Hemoglobin A1c  2. Mixed hyperlipidemia I will plan to start a statin. We will reassess in 3-6 months.  - atorvastatin (LIPITOR) 20 MG tablet; Take 1 tablet (20 mg total) by mouth daily.  Dispense: 90 tablet; Refill: 3  3. Dermoid cyst of scalp Surgical excision pending.  Return in about 3 months (around 12/19/2022) for Reassessment.   Haydee Salter, MD

## 2022-09-19 LAB — HEMOGLOBIN A1C: Hgb A1c MFr Bld: 6.8 % — ABNORMAL HIGH (ref 4.6–6.5)

## 2022-09-19 LAB — GLUCOSE, RANDOM: Glucose, Bld: 105 mg/dL — ABNORMAL HIGH (ref 70–99)

## 2022-09-23 DIAGNOSIS — L7211 Pilar cyst: Secondary | ICD-10-CM | POA: Diagnosis not present

## 2022-12-05 ENCOUNTER — Other Ambulatory Visit: Payer: Self-pay | Admitting: Family Medicine

## 2022-12-05 DIAGNOSIS — R7303 Prediabetes: Secondary | ICD-10-CM

## 2022-12-20 ENCOUNTER — Encounter: Payer: Self-pay | Admitting: Family Medicine

## 2022-12-20 ENCOUNTER — Ambulatory Visit: Payer: 59 | Admitting: Family Medicine

## 2022-12-20 VITALS — BP 128/96 | HR 110 | Temp 97.8°F | Ht 62.0 in | Wt 200.4 lb

## 2022-12-20 DIAGNOSIS — E119 Type 2 diabetes mellitus without complications: Secondary | ICD-10-CM

## 2022-12-20 DIAGNOSIS — F419 Anxiety disorder, unspecified: Secondary | ICD-10-CM

## 2022-12-20 DIAGNOSIS — F32A Depression, unspecified: Secondary | ICD-10-CM | POA: Diagnosis not present

## 2022-12-20 DIAGNOSIS — E782 Mixed hyperlipidemia: Secondary | ICD-10-CM | POA: Diagnosis not present

## 2022-12-20 DIAGNOSIS — R69 Illness, unspecified: Secondary | ICD-10-CM | POA: Diagnosis not present

## 2022-12-20 DIAGNOSIS — D234 Other benign neoplasm of skin of scalp and neck: Secondary | ICD-10-CM

## 2022-12-20 NOTE — Assessment & Plan Note (Signed)
Continue atorvastatin 20 mg daily. I will reassess fasting lipids.

## 2022-12-20 NOTE — Assessment & Plan Note (Signed)
Larger cyst now removed. She should follow-up with dermatology concerning removal of the remaining scalp cyst.

## 2022-12-20 NOTE — Assessment & Plan Note (Signed)
Stable on escitalopram 10 mg daily.

## 2022-12-20 NOTE — Progress Notes (Signed)
[facility] [address] [place] [address] Dept: [phone] Dept Fax: [phone]  Chronic Care Office Visit  Subjective:    Patient ID: Kristen [name], female    DOB: 1980/12/01, 42 y.o..   MRN: [id number]  Chief Complaint  Patient presents with   Follow-up    3 month FU, no concerns    History of Present Illness:  Patient is in today for reassessment of chronic medical issues.  Kristen [name] has a history of Type 2 diabetes. She is managed on metformin 500 mg daily.    Kristen [name] has a history of hyperlipidemia. She is now on atorvastatin 20 mg daily.  Kristen [name] has  history of anxiety with depression. She is managed on escitalopram 10 mg daily. She feels her mood is doing well.   Kristen [name] has multiple cysts of the scalp, which have been an issue for more than 20 years. She recently underwent surgical excision of the larger cyst on her left parietal scalp. She notes she has two more smaller ones that may need ot be addressed as well.  Past Medical History: Patient Active Problem List   Diagnosis Date Noted   Dermoid cyst of scalp 06/18/2022   Pharyngitis 05/16/2022   Type 2 diabetes mellitus ([place]) 12/03/2021   GERD (gastroesophageal reflux disease) 11/14/2021   Hyperlipidemia 11/22/2020   Seizure disorder ([place]) 02/19/2016   Interstitial cystitis 11/23/2014   PMS (premenstrual syndrome) 11/23/2014   Pelvic pain in female 11/23/2014   History of suicide attempt 11/23/2014   Anxiety and depression 11/23/2014   Past Surgical History:  Procedure Laterality Date   CYST EXCISION  09/2022   EXPLORATORY LAPAROTOMY     TOTAL LAPAROSCOPIC HYSTERECTOMY WITH SALPINGECTOMY Bilateral 12/31/2013   Done at [facility] for pelvic pain, benign pathology and no endometriosis seen   Family History  Problem Relation Age of Onset   Cancer Mother        Thyroid   Hyperlipidemia Mother    Hypertension Mother    Diabetes Mother    Cancer  Maternal Grandmother        Breast, kidney   Mental illness Maternal Grandmother    Heart disease Paternal Grandfather    Outpatient Medications Prior to Visit  Medication Sig Dispense Refill   atorvastatin (LIPITOR) 20 MG tablet Take 1 tablet (20 mg total) by mouth daily. 90 tablet 3   escitalopram (LEXAPRO) 10 MG tablet Take 1 tablet by mouth once daily 90 tablet 3   metFORMIN (GLUCOPHAGE) 500 MG tablet Take 1 tablet by mouth once daily with breakfast 90 tablet 3   Multiple Vitamin (MULTIVITAMIN) tablet Take 1 tablet by mouth daily.     omeprazole (PRILOSEC OTC) 20 MG tablet Take 20 mg by mouth daily.     clobetasol ointment (TEMOVATE) 0.05 % Apply to affected area every night for 4 weeks, then every other day for 4 weeks and then twice a week for 4 weeks or until resolution. (Patient not taking: Reported on 12/20/2022) 30 g 5   hydrocortisone (ANUSOL-HC) 2.5 % rectal cream Place 1 application rectally 2 (two) times daily. (Patient not taking: Reported on 09/18/2022) 30 g 0   lidocaine (XYLOCAINE) 2 % jelly Apply 1 application. topically as needed. 30 mL 2   No facility-administered medications prior to visit.   Allergies  Allergen Reactions   Amitriptyline Other (See Comments)    Daytime drowsiness   Penicillins Rash    rash  Objective:   Today's Vitals   12/20/22 1508  BP: (!) 128/96  Pulse: (!) 110  Temp: 97.8 F (36.6 C)  TempSrc: Temporal  SpO2: 95%  Weight: 200 lb 6.4 oz (90.9 kg)  Height: '5\' 2"'$  (1.575 m)   Body mass index is 36.65 kg/m.   General: Well developed, well nourished. No acute distress. Skin: Surgical site on scalp is healing well. There is a 2-3 cm cystic lesion on the right parietal scalp. Psych: Alert and oriented. Normal mood and affect.  Health Maintenance Due  Topic Date Due   Hepatitis C Screening  Never done   DTaP/Tdap/Td (1 - Tdap) Never done        12/20/2022    3:28 PM 12/20/2022    3:11 PM 03/18/2022    3:00 PM  Depression screen PHQ  2/9  Decreased Interest 1 2 0  Down, Depressed, Hopeless 0 0 0  PHQ - 2 Score 1 2 0  Altered sleeping 1    Tired, decreased energy 2    Change in appetite 1    Feeling bad or failure about yourself  0    Trouble concentrating 1    Moving slowly or fidgety/restless 0    Suicidal thoughts 0    PHQ-9 Score 6    Difficult doing work/chores Not difficult at all        12/20/2022    3:29 PM  GAD 7 : Generalized Anxiety Score  Nervous, Anxious, on Edge 0  Control/stop worrying 0  Worry too much - different things 0  Trouble relaxing 0  Restless 0  Easily annoyed or irritable 0  Afraid - awful might happen 0  Total GAD 7 Score 0  Anxiety Difficulty Not difficult at all   Assessment & Plan:   Problem List Items Addressed This Visit       Endocrine   Type 2 diabetes mellitus ([place]) - Primary    We will reassess the A1c today. Continue metformin 500 mg daily.      Relevant Orders   Glucose, random   Hemoglobin A1c     Other   Anxiety and depression    Stable on escitalopram 10 mg daily.      Hyperlipidemia    Continue atorvastatin 20 mg daily. I will reassess fasting lipids.      Relevant Orders   Lipid panel   Dermoid cyst of scalp    Larger cyst now removed. She should follow-up with dermatology concerning removal of the remaining scalp cyst.      Return in about 3 months (around 03/20/2023) for Reassessment.   Haydee Salter, MD

## 2022-12-20 NOTE — Assessment & Plan Note (Signed)
We will reassess the A1c today. Continue metformin 500 mg daily.

## 2022-12-27 ENCOUNTER — Other Ambulatory Visit: Payer: 59

## 2023-01-05 ENCOUNTER — Encounter: Payer: Self-pay | Admitting: Obstetrics & Gynecology

## 2023-01-05 DIAGNOSIS — L900 Lichen sclerosus et atrophicus: Secondary | ICD-10-CM

## 2023-01-06 ENCOUNTER — Other Ambulatory Visit (INDEPENDENT_AMBULATORY_CARE_PROVIDER_SITE_OTHER): Payer: 59

## 2023-01-06 ENCOUNTER — Encounter: Payer: Self-pay | Admitting: Family Medicine

## 2023-01-06 DIAGNOSIS — E782 Mixed hyperlipidemia: Secondary | ICD-10-CM

## 2023-01-06 DIAGNOSIS — E119 Type 2 diabetes mellitus without complications: Secondary | ICD-10-CM

## 2023-01-06 LAB — LIPID PANEL
Cholesterol: 157 mg/dL (ref 0–200)
HDL: 39.4 mg/dL (ref >39.00)
LDL Cholesterol: 85 mg/dL (ref 0–99)
NonHDL: 118.03
Total CHOL/HDL Ratio: 4
Triglycerides: 163 mg/dL — ABNORMAL HIGH (ref 0.0–149.0)
VLDL: 32.6 mg/dL (ref 0.0–40.0)

## 2023-01-06 LAB — GLUCOSE, RANDOM: Glucose, Bld: 121 mg/dL — ABNORMAL HIGH (ref 70–99)

## 2023-01-06 LAB — HEMOGLOBIN A1C: Hgb A1c MFr Bld: 6.8 % — ABNORMAL HIGH (ref 4.6–6.5)

## 2023-01-06 NOTE — Progress Notes (Signed)
Pt here for labs

## 2023-01-07 ENCOUNTER — Encounter: Payer: Self-pay | Admitting: Obstetrics & Gynecology

## 2023-01-07 DIAGNOSIS — L900 Lichen sclerosus et atrophicus: Secondary | ICD-10-CM

## 2023-01-07 HISTORY — DX: Lichen sclerosus et atrophicus: L90.0

## 2023-01-07 MED ORDER — CLOBETASOL PROPIONATE 0.05 % EX OINT: TOPICAL_OINTMENT | CUTANEOUS | 5 refills | Status: AC

## 2023-01-30 ENCOUNTER — Ambulatory Visit: Payer: Self-pay | Admitting: Internal Medicine

## 2023-02-03 ENCOUNTER — Ambulatory Visit: Payer: Self-pay | Admitting: Internal Medicine

## 2023-02-14 ENCOUNTER — Encounter: Payer: Self-pay | Admitting: Internal Medicine

## 2023-02-14 ENCOUNTER — Ambulatory Visit: Payer: 59 | Admitting: Internal Medicine

## 2023-02-14 VITALS — BP 118/78 | HR 104 | Temp 98.0°F | Resp 18 | Ht 62.0 in | Wt 204.7 lb

## 2023-02-14 DIAGNOSIS — F32A Depression, unspecified: Secondary | ICD-10-CM

## 2023-02-14 DIAGNOSIS — Z7984 Long term (current) use of oral hypoglycemic drugs: Secondary | ICD-10-CM | POA: Diagnosis not present

## 2023-02-14 DIAGNOSIS — K219 Gastro-esophageal reflux disease without esophagitis: Secondary | ICD-10-CM | POA: Diagnosis not present

## 2023-02-14 DIAGNOSIS — R5383 Other fatigue: Secondary | ICD-10-CM | POA: Diagnosis not present

## 2023-02-14 DIAGNOSIS — F419 Anxiety disorder, unspecified: Secondary | ICD-10-CM

## 2023-02-14 DIAGNOSIS — M791 Myalgia, unspecified site: Secondary | ICD-10-CM | POA: Diagnosis not present

## 2023-02-14 DIAGNOSIS — Z23 Encounter for immunization: Secondary | ICD-10-CM | POA: Diagnosis not present

## 2023-02-14 DIAGNOSIS — E559 Vitamin D deficiency, unspecified: Secondary | ICD-10-CM

## 2023-02-14 DIAGNOSIS — E1165 Type 2 diabetes mellitus with hyperglycemia: Secondary | ICD-10-CM

## 2023-02-14 DIAGNOSIS — E782 Mixed hyperlipidemia: Secondary | ICD-10-CM | POA: Diagnosis not present

## 2023-02-14 MED ORDER — ESCITALOPRAM OXALATE 10 MG PO TABS: 10.0000 mg | ORAL_TABLET | Freq: Every day | ORAL | 1 refills | Status: DC

## 2023-02-14 NOTE — Patient Instructions (Addendum)
It was great seeing you today!  Plan discussed at today's visit: -Blood work ordered today, results will be uploaded to [facility].  -Try to hold Lipitor for 2 weeks to see if pain subsides -Lexapro refilled today -Tdap vaccine today  Follow up in: 1 month   Take care and let us know if you have any questions or concerns prior to your next visit.  Dr. Rosana Berger  Atorvastatin Tablets What is this medication? ATORVASTATIN (a TORE va sta tin) treats high cholesterol and reduces the risk of heart attack and stroke. It works by decreasing bad cholesterol and fats (such as LDL, triglycerides) and increasing good cholesterol (HDL) in your blood. It belongs to a group of medications called statins. Changes to diet and exercise are often combined with this medication. This medicine may be used for other purposes; ask your health care provider or pharmacist if you have questions. COMMON BRAND NAME(S): Lipitor What should I tell my care team before I take this medication? They need to know if you have any of these conditions: Diabetes Frequently drink alcohol Kidney disease Liver disease Muscle cramps, pain Stroke Thyroid disease An unusual or allergic reaction to atorvastatin, other medications, foods, dyes, or preservatives Pregnant or trying to get pregnant Breastfeeding How should I use this medication? Take this medication by mouth. Take it as directed on the label at the same time every day. You can take it with or without food. If it upsets your stomach, take it with food. Keep taking it unless your care team tells you to stop. Do not take this medication with grapefruit juice. Talk to your care team about the use of this medication in children. While it may be prescribed for children as young as 10 years for selected conditions, precautions do apply. Overdosage: If you think you have taken too much of this medicine contact a poison control center or emergency room at once. NOTE: This  medicine is only for you. Do not share this medicine with others. What if I miss a dose? If you miss a dose, take it as soon as you can unless it is more than 12 hours late. If it is more than 12 hours late, skip the missed dose. Take the next dose at the normal time. If it is almost time for your next dose, take only that dose. Do not take double or extra doses. What may interact with this medication? Do not take this medication with any of the following: Dasabuvir; ombitasvir; paritaprevir; ritonavir Lonafarnib Ombitasvir; paritaprevir; ritonavir Posaconazole Red yeast rice This medication may also interact with the following: Alcohol Certain antibiotics, such as erythromycin, clarithromycin Certain antivirals for HIV or hepatitis Certain medications for cholesterol, such as fenofibrate, gemfibrozil, niacin Certain medications for fungal infections, such as ketoconazole, itraconazole, voriconazole Colchicine Cyclosporine Digoxin Estrogen or progestin hormones Grapefruit juice Rifampin This list may not describe all possible interactions. Give your health care provider a list of all the medicines, herbs, non-prescription drugs, or dietary supplements you use. Also tell them if you smoke, drink alcohol, or use illegal drugs. Some items may interact with your medicine. What should I watch for while using this medication? Visit your care team for regular checks on your progress. Tell your care team if your symptoms do not start to get better or if they get worse. Your care team may tell you to stop taking this medication if you develop muscle problems. If your muscle problems do not go away after stopping this medication, contact your  care team. This medication may increase blood sugar. The risk may be higher in patients who already have diabetes. Ask your care team what you can do to lower your risk of diabetes while on this medication. If you are going to need surgery or other procedure,  tell your care team that you are using this medication. Taking this medication is only part of a total heart healthy program. Ask your care team if there are other changes you can make to improve your overall health. Drinking more than 2 alcoholic drinks every day with this medication can increase the risk of side effects. Talk to your care team if you wish to become pregnant or think you might be pregnant. This medication can cause serious birth defects. Talk to your care team before breastfeeding. Changes to your treatment plan may be needed. What side effects may I notice from receiving this medication? Side effects that you should report to your care team as soon as possible: Allergic reactions--skin rash, itching, hives, swelling of the face, lips, tongue, or throat High blood sugar (hyperglycemia)--increased thirst or amount of urine, unusual weakness or fatigue, blurry vision Liver injury--right upper belly pain, loss of appetite, nausea, light-colored stool, dark yellow or brown urine, yellowing skin or eyes, unusual weakness or fatigue Muscle injury--unusual weakness or fatigue, muscle pain, dark yellow or brown urine, decrease in amount of urine Redness, blistering, peeling, or loosening of the skin, including inside the mouth Side effects that usually do not require medical attention (report to your care team if they continue or are bothersome): Diarrhea Nausea Trouble sleeping Upset stomach This list may not describe all possible side effects. Call your doctor for medical advice about side effects. You may report side effects to FDA at 1-800-FDA-1088. Where should I keep my medication? Keep out of the reach of children and pets. Store at room temperature between 20 and 25 degrees C (68 and 77 degrees F). Get rid of any unused medication after the expiration date. To get rid of medications that are no longer needed or have expired: Take the medication to a medication take-back  program. Check with your pharmacy or law enforcement to find a location. If you cannot return the medication, check the label or package insert to see if the medication should be thrown out in the garbage or flushed down the toilet. If you are not sure, ask your care team. If it is safe to put it in the trash, take the medication out of the container. Mix the medication with cat litter, dirt, coffee grounds, or other unwanted substance. Seal the mixture in a bag or container. Put it in the trash. NOTE: This sheet is a summary. It may not cover all possible information. If you have questions about this medicine, talk to your doctor, pharmacist, or health care provider.  2023 Elsevier/Gold Standard (2007-12-26 00:00:00)

## 2023-02-14 NOTE — Progress Notes (Signed)
New Patient Office Visit  Subjective    Patient ID: Kristen [name], female    DOB: Sep 14, 1981  Age: 42 y.o. MRN: [id number]  CC:  Chief Complaint  Patient presents with   Establish Care    HPI [name] presents to establish care.  Diabetes, Type 2: -Last A1c 6.8% 2/24 -Medications: Metformin 500 mg once daily -Patient is compliant with the above medications and reports no side effects.  -Diet: tried to reduce drinking sodas  -Eye exam: UTD 8/23  -Foot exam: UTD 8/23 -Microalbumin: UTD 8/23 -Statin: yes -PNA vaccine: no -Denies symptoms of hypoglycemia, polyuria, polydipsia, numbness extremities, foot ulcers/trauma.   HLD: -Medications: Lipitor 20 mg -Patient is compliant with above medications - does report some muscle pains all over and severe fatigue - was worried about fibromyalgia  -Last lipid panel: Lipid Panel     Component Value Date/Time   CHOL 157 01/06/2023 0825   TRIG 163.0 (H) 01/06/2023 0825   HDL 39.40 01/06/2023 0825   CHOLHDL 4 01/06/2023 0825   VLDL 32.6 01/06/2023 0825   LDLCALC 85 01/06/2023 0825   LDLCALC 159 (H) 11/09/2020 1147   LDLDIRECT 166.0 06/18/2022 1131   GERD: -Currently on Prilosec 20 mg, controls symptoms well   MDD: -Mood status: stable -Current treatment: Lexapro 10 mg -Satisfied with current treatment?: yes -Duration of current treatment : years -Side effects: no Medication compliance: excellent compliance     12/20/2022    3:28 PM 12/20/2022    3:11 PM 03/18/2022    3:00 PM 12/17/2021    2:57 PM 11/14/2021   11:07 AM  Depression screen PHQ 2/9  Decreased Interest 1 2 0 0 0  Down, Depressed, Hopeless 0 0 0 0 0  PHQ - 2 Score 1 2 0 0 0  Altered sleeping 1      Tired, decreased energy 2      Change in appetite 1      Feeling bad or failure about yourself  0      Trouble concentrating 1      Moving slowly or fidgety/restless 0      Suicidal thoughts 0      PHQ-9 Score 6      Difficult doing work/chores Not  difficult at all       Health Maintenance: -Blood work UTD -Mammogram 7/23 Birads-2 -History of partial hysterectomy - no Paps, see Gynecology  -Tdap due  Outpatient Encounter Medications as of 02/14/2023  Medication Sig   atorvastatin (LIPITOR) 20 MG tablet Take 1 tablet (20 mg total) by mouth daily.   clobetasol ointment (TEMOVATE) 0.05 % Apply to affected area every night for 4 weeks, then every other day for 4 weeks and then twice a week for 4 weeks or until resolution.   escitalopram (LEXAPRO) 10 MG tablet Take 1 tablet by mouth once daily   metFORMIN (GLUCOPHAGE) 500 MG tablet Take 1 tablet by mouth once daily with breakfast   Multiple Vitamin (MULTIVITAMIN) tablet Take 1 tablet by mouth daily.   omeprazole (PRILOSEC OTC) 20 MG tablet Take 20 mg by mouth daily.   No facility-administered encounter medications on file as of 02/14/2023.    Past Medical History:  Diagnosis Date   Allergy    seasonal time allergies   Anxiety    sometimes have anxiety over new/changing things   Depression    Diabetes mellitus without complication ([place])    Endometriosis    Fainting spell    Frequent headaches  GERD (gastroesophageal reflux disease)    Hyperlipidemia    Lichen sclerosus 0000000   Seizures ([place])    have experience seizures in the past   Syncope     Past Surgical History:  Procedure Laterality Date   ABDOMINAL HYSTERECTOMY     CYST EXCISION  09/2022   EXPLORATORY LAPAROTOMY     TOTAL LAPAROSCOPIC HYSTERECTOMY WITH SALPINGECTOMY Bilateral 12/31/2013   Done at [facility] for pelvic pain, benign pathology and no endometriosis seen    Family History  Problem Relation Age of Onset   Cancer Mother        Thyroid   Hyperlipidemia Mother    Hypertension Mother    Diabetes Mother    Depression Mother    Cancer Maternal Grandmother        Breast, kidney   Mental illness Maternal Grandmother    Heart disease Paternal Grandfather     Social History   Socioeconomic History    Marital status: Married    Spouse name: Not on file   Number of children: 2   Years of education: Not on file   Highest education level: Not on file  Occupational History   Not on file  Tobacco Use   Smoking status: Former    Packs/day: 0.25    Years: 3.00    Additional pack years: 0.00    Total pack years: 0.75    Types: Cigarettes    Quit date: 1999    Years since quitting: 25.2    Passive exposure: Past   Smokeless tobacco: Never   Tobacco comments:    My smoking was many years ago  Vaping Use   Vaping Use: Never used  Substance and Sexual Activity   Alcohol use: Not Currently    Alcohol/week: 2.0 standard drinks of alcohol    Types: 1 Shots of liquor, 1 Standard drinks or equivalent per week    Comment: Very little   Drug use: No   Sexual activity: Yes    Birth control/protection: Other-see comments    Comment: Only have ovaries left, everything else was removed  Other Topics Concern   Not on file  Social History Narrative       Social Determinants of Health   Financial Resource Strain: Not on file  Food Insecurity: Not on file  Transportation Needs: Not on file  Physical Activity: Not on file  Stress: Not on file  Social Connections: Not on file  Intimate Partner Violence: Not on file    Review of Systems  Constitutional:  Positive for malaise/fatigue. Negative for chills and fever.  Respiratory:  Negative for shortness of breath.   Cardiovascular:  Negative for chest pain.  Gastrointestinal:  Negative for abdominal pain and diarrhea.  Musculoskeletal:  Positive for myalgias.        Objective    BP 118/78   Pulse (!) 104   Temp 98 F (36.7 C)   Resp 18   Ht 5\' 2"  (1.575 m)   Wt 204 lb 11.2 oz (92.9 kg)   SpO2 95%   BMI 37.44 kg/m   Physical Exam Constitutional:      Appearance: Normal appearance.  HENT:     Head: Normocephalic and atraumatic.     Mouth/Throat:     Mouth: Mucous membranes are moist.     Pharynx: Oropharynx is clear.   Eyes:     Extraocular Movements: Extraocular movements intact.     Conjunctiva/sclera: Conjunctivae normal.     Pupils: Pupils are equal,  round, and reactive to light.  Cardiovascular:     Rate and Rhythm: Normal rate and regular rhythm.  Pulmonary:     Effort: Pulmonary effort is normal.     Breath sounds: Normal breath sounds.  Musculoskeletal:     Right lower leg: No edema.     Left lower leg: No edema.  Skin:    General: Skin is warm and dry.  Neurological:     General: No focal deficit present.     Mental Status: She is alert. Mental status is at baseline.  Psychiatric:        Mood and Affect: Mood normal.        Behavior: Behavior normal.         Assessment & Plan:   1. Type 2 diabetes mellitus with hyperglycemia, without long-term current use of insulin ([place]): A1c in February controlled at 6.8%. Continue Metformin 500 mg daily.   2. Mixed hyperlipidemia: Reviewed lipid panel with the patient from February. Patient states she is having some myalgias but did not know statin could cause this side effects. Recommend holding statin for 2 weeks to see if pain resolves. Will obtain a CK today.  3. Gastroesophageal reflux disease, unspecified whether esophagitis present: Stable, continue Prilosec 20 mg.   4. Anxiety and depression: Stable, continue Lexapro 10 mg, refilled.  - escitalopram (LEXAPRO) 10 MG tablet; Take 1 tablet (10 mg total) by mouth daily.  Dispense: 90 tablet; Refill: 1  5. Myalgia: May be a side effect of the statin, will trial off the medication for a few weeks. Today obtain inflammatory markers, ANA and CK.  - CK (Creatine Kinase) - Sed Rate (ESR) - C-reactive protein - Antinuclear Antib (ANA)  6. Other fatigue: Check TSH and Vitamin D today.  - TSH - Vitamin D (25 hydroxy)  7. Need for Tdap vaccination: Tdap administered.   - Tdap vaccine greater than or equal to 7yo IM   Return in about 4 weeks (around 03/14/2023).   Teodora Medici,  DO

## 2023-02-16 LAB — ANTI-NUCLEAR AB-TITER (ANA TITER): ANA Titer 1: 1:80 {titer} — ABNORMAL HIGH

## 2023-02-16 LAB — CK: Total CK: 56 U/L (ref 29–143)

## 2023-02-16 LAB — TSH: TSH: 1.16 mIU/L

## 2023-02-16 LAB — SEDIMENTATION RATE: Sed Rate: 9 mm/h (ref 0–20)

## 2023-02-16 LAB — C-REACTIVE PROTEIN: CRP: 4.1 mg/L (ref <8.0)

## 2023-02-16 LAB — ANA: Anti Nuclear Antibody (ANA): POSITIVE — AB

## 2023-02-16 LAB — VITAMIN D 25 HYDROXY (VIT D DEFICIENCY, FRACTURES): Vit D, 25-Hydroxy: 28 ng/mL — ABNORMAL LOW (ref 30–100)

## 2023-02-17 MED ORDER — VITAMIN D (ERGOCALCIFEROL) 1.25 MG (50000 UNIT) PO CAPS: 50000.0000 [IU] | ORAL_CAPSULE | ORAL | 0 refills | Status: DC

## 2023-02-17 NOTE — Addendum Note (Signed)
Addended by: Teodora Medici on: 02/17/2023 09:31 AM   Modules accepted: Orders

## 2023-03-16 NOTE — Progress Notes (Unsigned)
Established Patient Office Visit  Subjective    Patient ID: Kristen [name], female    DOB: 12/11/1980  Age: 42 y.o. MRN: [id number]  CC:  No chief complaint on file.   HPI GENNY CAULDER presents to follow up on chronic medical conditions.  Diabetes, Type 2: -Last A1c 6.8% 2/24 -Medications: Metformin 500 mg once daily -Patient is compliant with the above medications and reports no side effects.  -Diet: tried to reduce drinking sodas  -Eye exam: UTD 8/23  -Foot exam: UTD 8/23 -Microalbumin: UTD 8/23 -Statin: yes -PNA vaccine: no -Denies symptoms of hypoglycemia, polyuria, polydipsia, numbness extremities, foot ulcers/trauma.   HLD: -Medications: Lipitor 20 mg -Patient is compliant with above medications - does report some muscle pains all over and severe fatigue - was worried about fibromyalgia  -Last lipid panel: Lipid Panel     Component Value Date/Time   CHOL 157 01/06/2023 0825   TRIG 163.0 (H) 01/06/2023 0825   HDL 39.40 01/06/2023 0825   CHOLHDL 4 01/06/2023 0825   VLDL 32.6 01/06/2023 0825   LDLCALC 85 01/06/2023 0825   LDLCALC 159 (H) 11/09/2020 1147   LDLDIRECT 166.0 06/18/2022 1131   GERD: -Currently on Prilosec 20 mg, controls symptoms well   MDD: -Mood status: stable -Current treatment: Lexapro 10 mg -Satisfied with current treatment?: yes -Duration of current treatment : years -Side effects: no Medication compliance: excellent compliance     12/20/2022    3:28 PM 12/20/2022    3:11 PM 03/18/2022    3:00 PM 12/17/2021    2:57 PM 11/14/2021   11:07 AM  Depression screen PHQ 2/9  Decreased Interest 1 2 0 0 0  Down, Depressed, Hopeless 0 0 0 0 0  PHQ - 2 Score 1 2 0 0 0  Altered sleeping 1      Tired, decreased energy 2      Change in appetite 1      Feeling bad or failure about yourself  0      Trouble concentrating 1      Moving slowly or fidgety/restless 0      Suicidal thoughts 0      PHQ-9 Score 6      Difficult doing work/chores Not  difficult at all       Health Maintenance: -Blood work UTD -Mammogram 7/23 Birads-2 -History of partial hysterectomy - no Paps, see Gynecology  -Tdap due  Outpatient Encounter Medications as of 03/17/2023  Medication Sig   atorvastatin (LIPITOR) 20 MG tablet Take 1 tablet (20 mg total) by mouth daily.   clobetasol ointment (TEMOVATE) 0.05 % Apply to affected area every night for 4 weeks, then every other day for 4 weeks and then twice a week for 4 weeks or until resolution.   escitalopram (LEXAPRO) 10 MG tablet Take 1 tablet (10 mg total) by mouth daily.   metFORMIN (GLUCOPHAGE) 500 MG tablet Take 1 tablet by mouth once daily with breakfast   Multiple Vitamin (MULTIVITAMIN) tablet Take 1 tablet by mouth daily.   omeprazole (PRILOSEC OTC) 20 MG tablet Take 20 mg by mouth daily.   Vitamin D, Ergocalciferol, (DRISDOL) 1.25 MG (50000 UNIT) CAPS capsule Take 1 capsule (50,000 Units total) by mouth every 7 (seven) days.   No facility-administered encounter medications on file as of 03/17/2023.    Past Medical History:  Diagnosis Date   Allergy    seasonal time allergies   Anxiety    sometimes have anxiety over new/changing things  Depression    Diabetes mellitus without complication (HCC)    Endometriosis    Fainting spell    Frequent headaches    GERD (gastroesophageal reflux disease)    Hyperlipidemia    Lichen sclerosus 01/07/2023   Seizures (HCC)    have experience seizures in the past   Syncope     Past Surgical History:  Procedure Laterality Date   ABDOMINAL HYSTERECTOMY     CYST EXCISION  09/2022   EXPLORATORY LAPAROTOMY     TOTAL LAPAROSCOPIC HYSTERECTOMY WITH SALPINGECTOMY Bilateral 12/31/2013   Done at [facility] for pelvic pain, benign pathology and no endometriosis seen    Family History  Problem Relation Age of Onset   Cancer Mother        Thyroid   Hyperlipidemia Mother    Hypertension Mother    Diabetes Mother    Depression Mother    Cancer Maternal  Grandmother        Breast, kidney   Mental illness Maternal Grandmother    Heart disease Paternal Grandfather     Social History   Socioeconomic History   Marital status: Married    Spouse name: Not on file   Number of children: 2   Years of education: Not on file   Highest education level: Associate degree: academic program  Occupational History   Not on file  Tobacco Use   Smoking status: Former    Packs/day: 0.25    Years: 3.00    Additional pack years: 0.00    Total pack years: 0.75    Types: Cigarettes    Quit date: 1999    Years since quitting: 25.3    Passive exposure: Past   Smokeless tobacco: Never   Tobacco comments:    My smoking was many years ago  Vaping Use   Vaping Use: Never used  Substance and Sexual Activity   Alcohol use: Not Currently    Alcohol/week: 2.0 standard drinks of alcohol    Types: 1 Shots of liquor, 1 Standard drinks or equivalent per week    Comment: Very little   Drug use: No   Sexual activity: Yes    Birth control/protection: Other-see comments    Comment: Only have ovaries left, everything else was removed  Other Topics Concern   Not on file  Social History Narrative       Social Determinants of Health   Financial Resource Strain: Medium Risk (03/11/2023)   Overall Financial Resource Strain (CARDIA)    Difficulty of Paying Living Expenses: Somewhat hard  Food Insecurity: Food Insecurity Present (03/11/2023)   Hunger Vital Sign    Worried About Running Out of Food in the Last Year: Sometimes true    Ran Out of Food in the Last Year: Sometimes true  Transportation Needs: No Transportation Needs (03/11/2023)   PRAPARE - Administrator, Civil Service (Medical): No    Lack of Transportation (Non-Medical): No  Physical Activity: Unknown (03/11/2023)   Exercise Vital Sign    Days of Exercise per Week: 0 days    Minutes of Exercise per Session: Not on file  Stress: Stress Concern Present (03/11/2023)   [employer]  of Occupational Health - Occupational Stress Questionnaire    Feeling of Stress : To some extent  Social Connections: Socially Isolated (03/11/2023)   Social Connection and Isolation Panel [NHANES]    Frequency of Communication with Friends and Family: Never    Frequency of Social Gatherings with Friends and Family: Never  Attends Religious Services: Never    Active Member of Clubs or Organizations: No    Attends Engineer, structural: Not on file    Marital Status: Married  Catering manager Violence: Not on file    Review of Systems  Constitutional:  Positive for malaise/fatigue. Negative for chills and fever.  Respiratory:  Negative for shortness of breath.   Cardiovascular:  Negative for chest pain.  Gastrointestinal:  Negative for abdominal pain and diarrhea.  Musculoskeletal:  Positive for myalgias.        Objective    There were no vitals taken for this visit.  Physical Exam Constitutional:      Appearance: Normal appearance.  HENT:     Head: Normocephalic and atraumatic.     Mouth/Throat:     Mouth: Mucous membranes are moist.     Pharynx: Oropharynx is clear.  Eyes:     Extraocular Movements: Extraocular movements intact.     Conjunctiva/sclera: Conjunctivae normal.     Pupils: Pupils are equal, round, and reactive to light.  Cardiovascular:     Rate and Rhythm: Normal rate and regular rhythm.  Pulmonary:     Effort: Pulmonary effort is normal.     Breath sounds: Normal breath sounds.  Musculoskeletal:     Right lower leg: No edema.     Left lower leg: No edema.  Skin:    General: Skin is warm and dry.  Neurological:     General: No focal deficit present.     Mental Status: She is alert. Mental status is at baseline.  Psychiatric:        Mood and Affect: Mood normal.        Behavior: Behavior normal.         Assessment & Plan:   1. Type 2 diabetes mellitus with hyperglycemia, without long-term current use of insulin (HCC): A1c in  February controlled at 6.8%. Continue Metformin 500 mg daily.   2. Mixed hyperlipidemia: Reviewed lipid panel with the patient from February. Patient states she is having some myalgias but did not know statin could cause this side effects. Recommend holding statin for 2 weeks to see if pain resolves. Will obtain a CK today.  3. Gastroesophageal reflux disease, unspecified whether esophagitis present: Stable, continue Prilosec 20 mg.   4. Anxiety and depression: Stable, continue Lexapro 10 mg, refilled.  - escitalopram (LEXAPRO) 10 MG tablet; Take 1 tablet (10 mg total) by mouth daily.  Dispense: 90 tablet; Refill: 1  5. Myalgia: May be a side effect of the statin, will trial off the medication for a few weeks. Today obtain inflammatory markers, ANA and CK.  - CK (Creatine Kinase) - Sed Rate (ESR) - C-reactive protein - Antinuclear Antib (ANA)  6. Other fatigue: Check TSH and Vitamin D today.  - TSH - Vitamin D (25 hydroxy)  7. Need for Tdap vaccination: Tdap administered.   - Tdap vaccine greater than or equal to 7yo IM   No follow-ups on file.   Margarita Mail, DO

## 2023-03-17 ENCOUNTER — Ambulatory Visit: Payer: 59 | Admitting: Internal Medicine

## 2023-03-17 ENCOUNTER — Encounter: Payer: Self-pay | Admitting: Internal Medicine

## 2023-03-17 VITALS — BP 124/74 | HR 91 | Temp 98.2°F | Resp 16 | Ht 62.0 in | Wt 204.0 lb

## 2023-03-17 DIAGNOSIS — R1900 Intra-abdominal and pelvic swelling, mass and lump, unspecified site: Secondary | ICD-10-CM | POA: Diagnosis not present

## 2023-03-17 DIAGNOSIS — M130 Polyarthritis, unspecified: Secondary | ICD-10-CM

## 2023-03-17 DIAGNOSIS — R5382 Chronic fatigue, unspecified: Secondary | ICD-10-CM

## 2023-03-17 DIAGNOSIS — R768 Other specified abnormal immunological findings in serum: Secondary | ICD-10-CM

## 2023-03-26 DIAGNOSIS — R5382 Chronic fatigue, unspecified: Secondary | ICD-10-CM | POA: Diagnosis not present

## 2023-03-26 DIAGNOSIS — M791 Myalgia, unspecified site: Secondary | ICD-10-CM | POA: Diagnosis not present

## 2023-03-26 DIAGNOSIS — M533 Sacrococcygeal disorders, not elsewhere classified: Secondary | ICD-10-CM | POA: Diagnosis not present

## 2023-03-26 DIAGNOSIS — M2550 Pain in unspecified joint: Secondary | ICD-10-CM | POA: Diagnosis not present

## 2023-03-27 ENCOUNTER — Ambulatory Visit
Admission: RE | Admit: 2023-03-27 | Discharge: 2023-03-27 | Disposition: A | Discharge status: Home / Self Care | Payer: 59 | Source: Ambulatory Visit | Attending: Internal Medicine | Admitting: Internal Medicine

## 2023-03-27 DIAGNOSIS — R1902 Left upper quadrant abdominal swelling, mass and lump: Secondary | ICD-10-CM | POA: Diagnosis not present

## 2023-03-27 DIAGNOSIS — R1900 Intra-abdominal and pelvic swelling, mass and lump, unspecified site: Secondary | ICD-10-CM | POA: Insufficient documentation

## 2023-03-31 ENCOUNTER — Ambulatory Visit: Payer: 59 | Admitting: Family Medicine

## 2023-04-03 ENCOUNTER — Telehealth: Payer: Self-pay

## 2023-04-03 NOTE — Telephone Encounter (Signed)
Pt called for imaging results.  Shared provider's note.  Margarita Mail, DO 03/30/2023  6:19 PM EDT   Abdominal ultrasound showing a lipoma or collection of benign fatty tissue where she was concerned, no hernias.  Pt has done some research and feels that the next step should be a MRI.   Is this necessary? Please advise.

## 2023-04-03 NOTE — Telephone Encounter (Signed)
Left detailed vm °

## 2023-04-10 ENCOUNTER — Ambulatory Visit: Payer: Self-pay | Admitting: Internal Medicine

## 2023-04-21 ENCOUNTER — Other Ambulatory Visit: Payer: Self-pay | Admitting: Internal Medicine

## 2023-04-21 DIAGNOSIS — Z1231 Encounter for screening mammogram for malignant neoplasm of breast: Secondary | ICD-10-CM

## 2023-04-25 ENCOUNTER — Ambulatory Visit: Payer: 59 | Admitting: Internal Medicine

## 2023-05-01 ENCOUNTER — Other Ambulatory Visit: Payer: Self-pay | Admitting: Internal Medicine

## 2023-05-01 DIAGNOSIS — E559 Vitamin D deficiency, unspecified: Secondary | ICD-10-CM

## 2023-05-01 NOTE — Telephone Encounter (Signed)
Requested medication (s) are due for refill today:   Provider to review  Requested medication (s) are on the active medication list:   Yes  Future visit scheduled:   Yes   Last ordered: 02/17/2023 #12, 0 refills  Non delegated refill    Requested Prescriptions  Pending Prescriptions Disp Refills   Vitamin D, Ergocalciferol, (DRISDOL) 1.25 MG (50000 UNIT) CAPS capsule [Pharmacy Med Name: Vitamin D (Ergocalciferol) 1.25 MG (50000 UT) Oral Capsule] 12 capsule 0    Sig: Take 1 capsule by mouth once a week     Endocrinology:  Vitamins - Vitamin D Supplementation 2 Failed - 05/01/2023  6:52 AM      Failed - Manual Review: Route requests for 50,000 IU strength to the provider      Failed - Vitamin D in normal range and within 360 days    Vit D, 25-Hydroxy  Date Value Ref Range Status  02/14/2023 28 (L) 30 - 100 ng/mL Final    Comment:    Vitamin D Status         25-OH Vitamin D: . Deficiency:                    <20 ng/mL Insufficiency:             20 - 29 ng/mL Optimal:                 > or = 30 ng/mL . For 25-OH Vitamin D testing on patients on  D2-supplementation and patients for whom quantitation  of D2 and D3 fractions is required, the QuestAssureD(TM) 25-OH VIT D, (D2,D3), LC/MS/MS is recommended: order  code 16109 (patients >39yrs). . See Note 1 . Note 1 . For additional information, please refer to  http://education.[facility].com/faq/FAQ199  (This link is being provided for informational/ educational purposes only.)          Passed - Ca in normal range and within 360 days    Calcium  Date Value Ref Range Status  06/18/2022 9.6 8.4 - 10.5 mg/dL Final   Calcium, Total  Date Value Ref Range Status  08/15/2013 9.0 8.5 - 10.1 mg/dL Final         Passed - Valid encounter within last 12 months    Recent Outpatient Visits           1 month ago Positive ANA (antinuclear antibody)   [facility] Margarita Mail, DO   2 months ago  Type 2 diabetes mellitus with hyperglycemia, without long-term current use of insulin [facility])   [facility] Margarita Mail, DO       Future Appointments             In 4 months Margarita Mail, DO [facility]

## 2023-05-02 NOTE — Telephone Encounter (Signed)
No answer from pt left detailed vm. °

## 2023-05-09 DIAGNOSIS — M797 Fibromyalgia: Secondary | ICD-10-CM | POA: Diagnosis not present

## 2023-06-02 ENCOUNTER — Ambulatory Visit
Admission: RE | Admit: 2023-06-02 | Discharge: 2023-06-02 | Disposition: A | Discharge status: Home / Self Care | Payer: 59 | Source: Ambulatory Visit | Attending: Internal Medicine | Admitting: Internal Medicine

## 2023-06-02 DIAGNOSIS — Z1231 Encounter for screening mammogram for malignant neoplasm of breast: Secondary | ICD-10-CM | POA: Insufficient documentation

## 2023-06-26 LAB — HM DIABETES EYE EXAM

## 2023-09-15 NOTE — Progress Notes (Unsigned)
Established Patient Office Visit  Subjective    Patient ID: Kristen [name], female    DOB: Feb 18, 1981  Age: 42 y.o. MRN: [id number]  CC:  No chief complaint on file.   HPI INDONESIA KRUPA presents to follow up on chronic medical conditions.   Diabetes, Type 2: -Last A1c 6.8% 2/24 -Medications: Metformin 500 mg once daily -Patient is compliant with the above medications and reports no side effects.  -Diet: tried to reduce drinking sodas  -Eye exam: UTD 8/23  -Foot exam: UTD 8/23 -Microalbumin: UTD 8/23 -Statin: yes -PNA vaccine: no -Denies symptoms of hypoglycemia, polyuria, polydipsia, numbness extremities, foot ulcers/trauma.   HLD: -Medications: Lipitor 20 mg - medication was held for 2 weeks to see if muscle/joint pains resolved which it did not -Last lipid panel: Lipid Panel     Component Value Date/Time   CHOL 157 01/06/2023 0825   TRIG 163.0 (H) 01/06/2023 0825   HDL 39.40 01/06/2023 0825   CHOLHDL 4 01/06/2023 0825   VLDL 32.6 01/06/2023 0825   LDLCALC 85 01/06/2023 0825   LDLCALC 159 (H) 11/09/2020 1147   LDLDIRECT 166.0 06/18/2022 1131   GERD: -Currently on Prilosec 20 mg, controls symptoms well   MDD: -Mood status: stable -Current treatment: Lexapro 10 mg -Satisfied with current treatment?: yes -Duration of current treatment : years -Side effects: no Medication compliance: excellent compliance     03/17/2023    1:16 PM 03/17/2023    1:13 PM 12/20/2022    3:28 PM 12/20/2022    3:11 PM 03/18/2022    3:00 PM  Depression screen PHQ 2/9  Decreased Interest 2 2 1 2  0  Down, Depressed, Hopeless 1 1 0 0 0  PHQ - 2 Score 3 3 1 2  0  Altered sleeping 1 0 1    Tired, decreased energy 3 0 2    Change in appetite 2 0 1    Feeling bad or failure about yourself  0 0 0    Trouble concentrating 1 0 1    Moving slowly or fidgety/restless 1 0 0    Suicidal thoughts 0 0 0    PHQ-9 Score 11 3 6     Difficult doing work/chores Somewhat difficult Not difficult at all  Not difficult at all     Health Maintenance: -Blood work UTD -Mammogram 7/23 Birads-2 -History of partial hysterectomy - no Paps, see Gynecology  -Tdap due  Outpatient Encounter Medications as of 09/16/2023  Medication Sig   atorvastatin (LIPITOR) 20 MG tablet Take 1 tablet (20 mg total) by mouth daily.   clobetasol ointment (TEMOVATE) 0.05 % Apply to affected area every night for 4 weeks, then every other day for 4 weeks and then twice a week for 4 weeks or until resolution.   escitalopram (LEXAPRO) 10 MG tablet Take 1 tablet (10 mg total) by mouth daily.   metFORMIN (GLUCOPHAGE) 500 MG tablet Take 1 tablet by mouth once daily with breakfast   Multiple Vitamin (MULTIVITAMIN) tablet Take 1 tablet by mouth daily.   omeprazole (PRILOSEC OTC) 20 MG tablet Take 20 mg by mouth daily.   Vitamin D, Ergocalciferol, (DRISDOL) 1.25 MG (50000 UNIT) CAPS capsule Take 1 capsule (50,000 Units total) by mouth every 7 (seven) days.   No facility-administered encounter medications on file as of 09/16/2023.    Past Medical History:  Diagnosis Date   Allergy    seasonal time allergies   Anxiety    sometimes have anxiety over new/changing things  Depression    Diabetes mellitus without complication (HCC)    Endometriosis    Fainting spell    Frequent headaches    GERD (gastroesophageal reflux disease)    Hyperlipidemia    Lichen sclerosus 01/07/2023   Seizures (HCC)    have experience seizures in the past   Syncope     Past Surgical History:  Procedure Laterality Date   ABDOMINAL HYSTERECTOMY     CYST EXCISION  09/2022   EXPLORATORY LAPAROTOMY     TOTAL LAPAROSCOPIC HYSTERECTOMY WITH SALPINGECTOMY Bilateral 12/31/2013   Done at [facility] for pelvic pain, benign pathology and no endometriosis seen    Family History  Problem Relation Age of Onset   Cancer Mother        Thyroid   Hyperlipidemia Mother    Hypertension Mother    Diabetes Mother    Depression Mother    Breast cancer  Maternal Grandmother    Cancer Maternal Grandmother        Breast, kidney   Mental illness Maternal Grandmother    Heart disease Paternal Grandfather     Social History   Socioeconomic History   Marital status: Married    Spouse name: Not on file   Number of children: 2   Years of education: Not on file   Highest education level: Associate degree: academic program  Occupational History   Not on file  Tobacco Use   Smoking status: Former    Current packs/day: 0.00    Average packs/day: 0.3 packs/day for 3.0 years (0.8 ttl pk-yrs)    Types: Cigarettes    Start date: 25    Quit date: 1999    Years since quitting: 25.8    Passive exposure: Past   Smokeless tobacco: Never   Tobacco comments:    My smoking was many years ago  Vaping Use   Vaping status: Never Used  Substance and Sexual Activity   Alcohol use: Not Currently    Alcohol/week: 2.0 standard drinks of alcohol    Types: 1 Shots of liquor, 1 Standard drinks or equivalent per week    Comment: Very little   Drug use: No   Sexual activity: Yes    Birth control/protection: Other-see comments    Comment: Only have ovaries left, everything else was removed  Other Topics Concern   Not on file  Social History Narrative       Social Determinants of Health   Financial Resource Strain: Medium Risk (03/11/2023)   Overall Financial Resource Strain (CARDIA)    Difficulty of Paying Living Expenses: Somewhat hard  Food Insecurity: Food Insecurity Present (03/11/2023)   Hunger Vital Sign    Worried About Running Out of Food in the Last Year: Sometimes true    Ran Out of Food in the Last Year: Sometimes true  Transportation Needs: No Transportation Needs (03/11/2023)   PRAPARE - Administrator, Civil Service (Medical): No    Lack of Transportation (Non-Medical): No  Physical Activity: Unknown (03/11/2023)   Exercise Vital Sign    Days of Exercise per Week: 0 days    Minutes of Exercise per Session: Not on file   Stress: Stress Concern Present (03/11/2023)   [employer] of Occupational Health - Occupational Stress Questionnaire    Feeling of Stress : To some extent  Social Connections: Socially Isolated (03/11/2023)   Social Connection and Isolation Panel [NHANES]    Frequency of Communication with Friends and Family: Never    Frequency of Social  Gatherings with Friends and Family: Never    Attends Religious Services: Never    Database administrator or Organizations: No    Attends Engineer, structural: Not on file    Marital Status: Married  Catering manager Violence: Not on file    Review of Systems  Constitutional:  Positive for malaise/fatigue. Negative for chills and fever.  Respiratory:  Negative for shortness of breath.   Cardiovascular:  Negative for chest pain.  Gastrointestinal:  Negative for abdominal pain and diarrhea.  Musculoskeletal:  Positive for joint pain and myalgias.        Objective    There were no vitals taken for this visit.  Physical Exam Constitutional:      Appearance: Normal appearance.  HENT:     Head: Normocephalic and atraumatic.  Eyes:     Conjunctiva/sclera: Conjunctivae normal.  Cardiovascular:     Rate and Rhythm: Normal rate and regular rhythm.  Pulmonary:     Effort: Pulmonary effort is normal.     Breath sounds: Normal breath sounds.  Abdominal:     General: Bowel sounds are normal. There is no distension.     Palpations: Abdomen is soft. There is no mass.     Tenderness: There is no abdominal tenderness. There is no guarding or rebound.     Hernia: No hernia is present.  Skin:    General: Skin is warm and dry.  Neurological:     General: No focal deficit present.     Mental Status: She is alert. Mental status is at baseline.  Psychiatric:        Mood and Affect: Mood normal.        Behavior: Behavior normal.         Assessment & Plan:   1. Positive ANA (antinuclear antibody)/Chronic fatigue/Polyarthritis: Still  having pain, no change with holding the statin. Positive ANA but negative inflammatory markers. Referral placed to Rheumatology for further work up at patient request.   - Ambulatory referral to Rheumatology  2. Abdominal wall bulge: Physical exam negative for hernia today, will obtain imaging to further evaluation.   - US Abdomen Complete; Future   No follow-ups on file.   Margarita Mail, DO

## 2023-09-16 ENCOUNTER — Ambulatory Visit: Payer: 59 | Admitting: Internal Medicine

## 2023-09-16 ENCOUNTER — Encounter: Payer: Self-pay | Admitting: Internal Medicine

## 2023-09-16 VITALS — BP 120/78 | HR 84 | Temp 98.4°F | Resp 18 | Ht 62.0 in | Wt 200.8 lb

## 2023-09-16 DIAGNOSIS — F419 Anxiety disorder, unspecified: Secondary | ICD-10-CM

## 2023-09-16 DIAGNOSIS — E1165 Type 2 diabetes mellitus with hyperglycemia: Secondary | ICD-10-CM | POA: Diagnosis not present

## 2023-09-16 DIAGNOSIS — Z7984 Long term (current) use of oral hypoglycemic drugs: Secondary | ICD-10-CM

## 2023-09-16 DIAGNOSIS — E782 Mixed hyperlipidemia: Secondary | ICD-10-CM | POA: Diagnosis not present

## 2023-09-16 DIAGNOSIS — K219 Gastro-esophageal reflux disease without esophagitis: Secondary | ICD-10-CM

## 2023-09-16 DIAGNOSIS — Z23 Encounter for immunization: Secondary | ICD-10-CM

## 2023-09-16 DIAGNOSIS — F32A Depression, unspecified: Secondary | ICD-10-CM

## 2023-09-16 LAB — POCT GLYCOSYLATED HEMOGLOBIN (HGB A1C): Hemoglobin A1C: 6.4 % — AB (ref 4.0–5.6)

## 2023-09-16 MED ORDER — OMEPRAZOLE 40 MG PO CPDR: 40.0000 mg | DELAYED_RELEASE_CAPSULE | Freq: Every day | ORAL | 3 refills | Status: DC

## 2023-09-16 MED ORDER — ATORVASTATIN CALCIUM 20 MG PO TABS: 20.0000 mg | ORAL_TABLET | Freq: Every day | ORAL | 1 refills | Status: DC

## 2023-09-16 MED ORDER — ESCITALOPRAM OXALATE 10 MG PO TABS: 10.0000 mg | ORAL_TABLET | Freq: Every day | ORAL | 1 refills | Status: DC

## 2023-09-16 MED ORDER — METFORMIN HCL 500 MG PO TABS: 500.0000 mg | ORAL_TABLET | Freq: Every day | ORAL | 1 refills | Status: DC

## 2023-09-17 LAB — MICROALBUMIN / CREATININE URINE RATIO
Creatinine, Urine: 103 mg/dL (ref 20–275)
Microalb Creat Ratio: 3 mg/g{creat} (ref <30)
Microalb, Ur: 0.3 mg/dL

## 2023-10-23 ENCOUNTER — Encounter: Payer: Self-pay | Admitting: Internal Medicine

## 2023-11-04 ENCOUNTER — Other Ambulatory Visit: Payer: Self-pay | Admitting: Internal Medicine

## 2023-11-04 DIAGNOSIS — M130 Polyarthritis, unspecified: Secondary | ICD-10-CM

## 2023-11-04 MED ORDER — GABAPENTIN 300 MG PO CAPS: 300.0000 mg | ORAL_CAPSULE | Freq: Three times a day (TID) | ORAL | 0 refills | Status: DC

## 2023-11-25 ENCOUNTER — Ambulatory Visit: Payer: 59 | Admitting: Internal Medicine

## 2024-03-26 ENCOUNTER — Other Ambulatory Visit: Payer: Self-pay | Admitting: Internal Medicine

## 2024-03-26 DIAGNOSIS — E782 Mixed hyperlipidemia: Secondary | ICD-10-CM

## 2024-03-26 DIAGNOSIS — F32A Depression, unspecified: Secondary | ICD-10-CM

## 2024-03-29 NOTE — Telephone Encounter (Signed)
 Requested medications are due for refill today.  yes  Requested medications are on the active medications list.  yes  Last refill. 09/16/2023  #90 1 rf for both  Future visit scheduled.   no  Notes to clinic.  Called pt  - left message to return call to schedule ov. Labs are expired.    Requested Prescriptions  Pending Prescriptions Disp Refills   escitalopram  (LEXAPRO ) 10 MG tablet [Pharmacy Med Name: Escitalopram  Oxalate 10 MG Oral Tablet] 90 tablet 0    Sig: Take 1 tablet by mouth once daily     Psychiatry:  Antidepressants - SSRI Failed - 03/29/2024 12:49 PM      Failed - Completed PHQ-2 or PHQ-9 in the last 360 days      Failed - Valid encounter within last 6 months    Recent Outpatient Visits   None             atorvastatin  (LIPITOR) 20 MG tablet [Pharmacy Med Name: Atorvastatin  Calcium  20 MG Oral Tablet] 90 tablet 0    Sig: Take 1 tablet by mouth once daily     Cardiovascular:  Antilipid - Statins Failed - 03/29/2024 12:49 PM      Failed - Valid encounter within last 12 months    Recent Outpatient Visits   None            Failed - Lipid Panel in normal range within the last 12 months    Cholesterol  Date Value Ref Range Status  01/06/2023 157 0 - 200 mg/dL Final    Comment:    ATP III Classification       Desirable:  < 200 mg/dL               Borderline High:  200 - 239 mg/dL          High:  > = 161 mg/dL   LDL Cholesterol (Calc)  Date Value Ref Range Status  11/09/2020 159 (H) mg/dL (calc) Final    Comment:    Reference range: <100 . Desirable range <100 mg/dL for primary prevention;   <70 mg/dL for patients with CHD or diabetic patients  with > or = 2 CHD risk factors. Aaron Aas LDL-C is now calculated using the Martin-Hopkins  calculation, which is a validated novel method providing  better accuracy than the Friedewald equation in the  estimation of LDL-C.  Melinda Sprawls et al. Erroll Heard. 0960;454(09): 2061-2068   (http://education.[facility].com/faq/FAQ164)    LDL Cholesterol  Date Value Ref Range Status  01/06/2023 85 0 - 99 mg/dL Final   Direct LDL  Date Value Ref Range Status  06/18/2022 166.0 mg/dL Final    Comment:    Optimal:  <100 mg/dLNear or Above Optimal:  100-129 mg/dLBorderline High:  130-159 mg/dLHigh:  160-189 mg/dLVery High:  >190 mg/dL   HDL  Date Value Ref Range Status  01/06/2023 39.40 >39.00 mg/dL Final   Triglycerides  Date Value Ref Range Status  01/06/2023 163.0 (H) 0.0 - 149.0 mg/dL Final    Comment:    Normal:  <150 mg/dLBorderline High:  150 - 199 mg/dL         Passed - Patient is not pregnant

## 2024-03-29 NOTE — Telephone Encounter (Signed)
 Called pt - left message to return call and schedule ov

## 2024-03-30 ENCOUNTER — Ambulatory Visit: Admitting: Internal Medicine

## 2024-03-30 ENCOUNTER — Other Ambulatory Visit: Payer: Self-pay

## 2024-03-30 VITALS — BP 120/76 | HR 82 | Temp 98.2°F | Resp 18 | Ht 62.0 in | Wt 202.9 lb

## 2024-03-30 DIAGNOSIS — Z1159 Encounter for screening for other viral diseases: Secondary | ICD-10-CM

## 2024-03-30 DIAGNOSIS — F32A Depression, unspecified: Secondary | ICD-10-CM | POA: Diagnosis not present

## 2024-03-30 DIAGNOSIS — Z1231 Encounter for screening mammogram for malignant neoplasm of breast: Secondary | ICD-10-CM

## 2024-03-30 DIAGNOSIS — E1165 Type 2 diabetes mellitus with hyperglycemia: Secondary | ICD-10-CM

## 2024-03-30 DIAGNOSIS — E782 Mixed hyperlipidemia: Secondary | ICD-10-CM | POA: Diagnosis not present

## 2024-03-30 DIAGNOSIS — F419 Anxiety disorder, unspecified: Secondary | ICD-10-CM | POA: Diagnosis not present

## 2024-03-30 MED ORDER — ESCITALOPRAM OXALATE 10 MG PO TABS: 10.0000 mg | ORAL_TABLET | Freq: Every day | ORAL | 1 refills | Status: DC

## 2024-03-30 MED ORDER — ATORVASTATIN CALCIUM 20 MG PO TABS: 20.0000 mg | ORAL_TABLET | Freq: Every day | ORAL | 1 refills | Status: DC

## 2024-03-30 MED ORDER — METFORMIN HCL 500 MG PO TABS: 500.0000 mg | ORAL_TABLET | Freq: Every day | ORAL | 1 refills | Status: DC

## 2024-03-30 NOTE — Progress Notes (Signed)
 Established Patient Office Visit  Subjective    Patient ID: Kristen [name], female    DOB: Apr 01, 1981  Age: 43 y.o. MRN: [id number]  CC:  Chief Complaint  Patient presents with   Medical Management of Chronic Issues    HPI Kristen [name] presents to follow up on chronic medical conditions.   Discussed the use of AI scribe software for clinical note transcription with the patient, who gave verbal consent to proceed.  History of Present Illness Kristen [name] is a 43 year old female with diabetes who presents for a follow-up visit and routine blood work.  She is undergoing routine blood work to monitor A1c, kidney function, liver function, electrolytes, and cholesterol levels. Blood sugar levels are stable, with a two-pound weight gain over six months. Diabetes management remains unchanged, and she is current with urine tests, foot exams, and eye exams. Her next eye exam is planned for July or August.  She is not taking gabapentin  due to a previous miscommunication and manages chronic pain without it. Current medications include cholesterol medication, Lexapro , and metformin , with prescriptions filled at [facility] in [facility].  She plans to reschedule her mammogram after July 15th and has experienced issues with her MyChart account. There was confusion regarding her primary care provider's tax ID affecting insurance coverage, leading to a canceled appointment and a billing dispute.   Diabetes, Type 2: -Last A1c 6.4% 10/24 -Medications: Metformin  500 mg once daily -Patient is compliant with the above medications and reports no side effects.  -Diet: tried to reduce drinking sodas  -Eye exam: UTD 8/24  -Foot exam: UTD 10/24 -Microalbumin: UTD 10/24 -Statin: yes -PNA vaccine: no - discussed, patient will think about it -Denies symptoms of hypoglycemia, polyuria, polydipsia, numbness extremities, foot ulcers/trauma.   HLD: -Medications: Lipitor 20 mg -Patient is compliant with  medications and denies side effects  -Last lipid panel: Lipid Panel     Component Value Date/Time   CHOL 157 01/06/2023 0825   TRIG 163.0 (H) 01/06/2023 0825   HDL 39.40 01/06/2023 0825   CHOLHDL 4 01/06/2023 0825   VLDL 32.6 01/06/2023 0825   LDLCALC 85 01/06/2023 0825   LDLCALC 159 (H) 11/09/2020 1147   LDLDIRECT 166.0 06/18/2022 1131   GERD: -Currently on Prilosec 20 mg  MDD: -Mood status: stable -Current treatment: Lexapro  10 mg -Satisfied with current treatment?: yes -Duration of current treatment : years -Side effects: no Medication compliance: excellent compliance     03/30/2024    1:41 PM 03/17/2023    1:16 PM 03/17/2023    1:13 PM 12/20/2022    3:28 PM 12/20/2022    3:11 PM  Depression screen PHQ 2/9  Decreased Interest 1 2 2 1 2   Down, Depressed, Hopeless 1 1 1  0 0  PHQ - 2 Score 2 3 3 1 2   Altered sleeping 1 1 0 1   Tired, decreased energy 1 3 0 2   Change in appetite 1 2 0 1   Feeling bad or failure about yourself  1 0 0 0   Trouble concentrating 1 1 0 1   Moving slowly or fidgety/restless 1 1 0 0   Suicidal thoughts 0 0 0 0   PHQ-9 Score 8 11 3 6    Difficult doing work/chores Somewhat difficult Somewhat difficult Not difficult at all Not difficult at all    Health Maintenance: -Blood work due -Mammogram 7/24 Birads-1 -History of partial hysterectomy - no Paps, see Gynecology   Outpatient Encounter  Medications as of 03/30/2024  Medication Sig   atorvastatin  (LIPITOR) 20 MG tablet Take 1 tablet (20 mg total) by mouth daily.   clobetasol  ointment (TEMOVATE ) 0.05 % Apply to affected area every night for 4 weeks, then every other day for 4 weeks and then twice a week for 4 weeks or until resolution.   escitalopram  (LEXAPRO ) 10 MG tablet Take 1 tablet (10 mg total) by mouth daily.   metFORMIN  (GLUCOPHAGE ) 500 MG tablet Take 1 tablet (500 mg total) by mouth daily with breakfast.   Multiple Vitamin (MULTIVITAMIN) tablet Take 1 tablet by mouth daily.   omeprazole   (PRILOSEC) 40 MG capsule Take 1 capsule (40 mg total) by mouth daily.   gabapentin  (NEURONTIN ) 300 MG capsule Take 1 capsule (300 mg total) by mouth 3 (three) times daily.   No facility-administered encounter medications on file as of 03/30/2024.    Past Medical History:  Diagnosis Date   Allergy    seasonal time allergies   Anxiety    sometimes have anxiety over new/changing things   Depression    Diabetes mellitus without complication (HCC)    Endometriosis    Fainting spell    Frequent headaches    GERD (gastroesophageal reflux disease)    Hyperlipidemia    Lichen sclerosus 01/07/2023   Seizures (HCC)    have experience seizures in the past   Syncope     Past Surgical History:  Procedure Laterality Date   ABDOMINAL HYSTERECTOMY     CYST EXCISION  09/2022   EXPLORATORY LAPAROTOMY     TOTAL LAPAROSCOPIC HYSTERECTOMY WITH SALPINGECTOMY Bilateral 12/31/2013   Done at [facility] for pelvic pain, benign pathology and no endometriosis seen    Family History  Problem Relation Age of Onset   Cancer Mother        Thyroid    Hyperlipidemia Mother    Hypertension Mother    Diabetes Mother    Depression Mother    Breast cancer Maternal Grandmother    Cancer Maternal Grandmother        Breast, kidney   Mental illness Maternal Grandmother    Heart disease Paternal Grandfather     Social History   Socioeconomic History   Marital status: Married    Spouse name: Not on file   Number of children: 2   Years of education: Not on file   Highest education level: Associate degree: academic program  Occupational History   Not on file  Tobacco Use   Smoking status: Former    Current packs/day: 0.00    Average packs/day: 0.3 packs/day for 3.0 years (0.8 ttl pk-yrs)    Types: Cigarettes    Start date: 4    Quit date: 1999    Years since quitting: 26.3    Passive exposure: Past   Smokeless tobacco: Never   Tobacco comments:    My smoking was many years ago  Vaping Use   Vaping  status: Never Used  Substance and Sexual Activity   Alcohol use: Not Currently    Alcohol/week: 2.0 standard drinks of alcohol    Types: 1 Shots of liquor, 1 Standard drinks or equivalent per week    Comment: Very little   Drug use: No   Sexual activity: Yes    Birth control/protection: Other-see comments    Comment: Only have ovaries left, everything else was removed  Other Topics Concern   Not on file  Social History Narrative       Social Drivers of Health  Financial Resource Strain: High Risk (03/30/2024)   Overall Financial Resource Strain (CARDIA)    Difficulty of Paying Living Expenses: Hard  Food Insecurity: Food Insecurity Present (03/30/2024)   Hunger Vital Sign    Worried About Running Out of Food in the Last Year: Often true    Ran Out of Food in the Last Year: Often true  Transportation Needs: No Transportation Needs (03/30/2024)   PRAPARE - Administrator, Civil Service (Medical): No    Lack of Transportation (Non-Medical): No  Physical Activity: Insufficiently Active (03/30/2024)   Exercise Vital Sign    Days of Exercise per Week: 1 day    Minutes of Exercise per Session: 20 min  Stress: Stress Concern Present (03/30/2024)   [employer] of Occupational Health - Occupational Stress Questionnaire    Feeling of Stress : Rather much  Social Connections: Socially Isolated (03/30/2024)   Social Connection and Isolation Panel [NHANES]    Frequency of Communication with Friends and Family: Never    Frequency of Social Gatherings with Friends and Family: Never    Attends Religious Services: Never    Database administrator or Organizations: No    Attends Engineer, structural: Not on file    Marital Status: Married  Catering manager Violence: Not on file    Review of Systems  All other systems reviewed and are negative.       Objective    BP 120/76 (Cuff Size: Large)   Pulse 82   Temp 98.2 F (36.8 C) (Oral)   Resp 18   Ht 5\' 2"   (1.575 m)   Wt 202 lb 14.4 oz (92 kg)   SpO2 98%   BMI 37.11 kg/m   Physical Exam Constitutional:      Appearance: Normal appearance.  HENT:     Head: Normocephalic and atraumatic.     Mouth/Throat:     Mouth: Mucous membranes are moist.     Pharynx: Oropharynx is clear.  Eyes:     Extraocular Movements: Extraocular movements intact.     Conjunctiva/sclera: Conjunctivae normal.     Pupils: Pupils are equal, round, and reactive to light.  Cardiovascular:     Rate and Rhythm: Normal rate and regular rhythm.  Pulmonary:     Effort: Pulmonary effort is normal.     Breath sounds: Normal breath sounds.  Skin:    General: Skin is warm and dry.  Neurological:     General: No focal deficit present.     Mental Status: She is alert. Mental status is at baseline.  Psychiatric:        Mood and Affect: Mood normal.        Behavior: Behavior normal.         Assessment & Plan:   Assessment and Plan Assessment & Plan Type 2 diabetes mellitus Pending A1c and lab work. Screenings current. Eye exam scheduled for July or August. - Order A1c and comprehensive lab work, including kidney, liver, and electrolytes. - Continue Metformin  and refill.  Hyperlipidemia Managed with current medication regimen. - Recheck labs.  - Refill cholesterol medication.  Depression Stable, managed with Lexapro . - Refill Lexapro .   General Health Maintenance Discussed pneumonia vaccine due to diabetes risk. She opted to wait until fall. Mammogram due in summer. - Discuss pneumonia vaccine in the fall. - Order mammogram for after July 15th.  - CBC w/Diff/Platelet - Comprehensive Metabolic Panel (CMET) - HgB A1c - metFORMIN  (GLUCOPHAGE ) 500 MG tablet; Take 1  tablet (500 mg total) by mouth daily with breakfast.  Dispense: 90 tablet; Refill: 1 - Lipid Profile - atorvastatin  (LIPITOR) 20 MG tablet; Take 1 tablet (20 mg total) by mouth daily.  Dispense: 90 tablet; Refill: 1 - escitalopram  (LEXAPRO )  10 MG tablet; Take 1 tablet (10 mg total) by mouth daily.  Dispense: 90 tablet; Refill: 1 - Hepatitis C Antibody - MM 3D SCREENING MAMMOGRAM BILATERAL BREAST; Future   Return in about 6 months (around 09/30/2024).   Rockney Cid, DO

## 2024-03-31 ENCOUNTER — Ambulatory Visit: Payer: Self-pay | Admitting: Internal Medicine

## 2024-03-31 LAB — CBC WITH DIFFERENTIAL/PLATELET
Absolute Lymphocytes: 3007 {cells}/uL (ref 850–3900)
Absolute Monocytes: 546 {cells}/uL (ref 200–950)
Basophils Absolute: 84 {cells}/uL (ref 0–200)
Basophils Relative: 1 %
Eosinophils Absolute: 134 {cells}/uL (ref 15–500)
Eosinophils Relative: 1.6 %
HCT: 42.9 % (ref 35.0–45.0)
Hemoglobin: 13.7 g/dL (ref 11.7–15.5)
MCH: 26.9 pg — ABNORMAL LOW (ref 27.0–33.0)
MCHC: 31.9 g/dL — ABNORMAL LOW (ref 32.0–36.0)
MCV: 84.3 fL (ref 80.0–100.0)
MPV: 9.9 fL (ref 7.5–12.5)
Monocytes Relative: 6.5 %
Neutro Abs: 4628 {cells}/uL (ref 1500–7800)
Neutrophils Relative %: 55.1 %
Platelets: 412 10*3/uL — ABNORMAL HIGH (ref 140–400)
RBC: 5.09 10*6/uL (ref 3.80–5.10)
RDW: 12.4 % (ref 11.0–15.0)
Total Lymphocyte: 35.8 %
WBC: 8.4 10*3/uL (ref 3.8–10.8)

## 2024-03-31 LAB — COMPREHENSIVE METABOLIC PANEL WITH GFR
AG Ratio: 1.7 (calc) (ref 1.0–2.5)
ALT: 22 U/L (ref 6–29)
AST: 15 U/L (ref 10–30)
Albumin: 4.3 g/dL (ref 3.6–5.1)
Alkaline phosphatase (APISO): 95 U/L (ref 31–125)
BUN/Creatinine Ratio: 9 (calc) (ref 6–22)
BUN: 6 mg/dL — ABNORMAL LOW (ref 7–25)
CO2: 30 mmol/L (ref 20–32)
Calcium: 9 mg/dL (ref 8.6–10.2)
Chloride: 100 mmol/L (ref 98–110)
Creat: 0.68 mg/dL (ref 0.50–0.99)
Globulin: 2.6 g/dL (ref 1.9–3.7)
Glucose, Bld: 87 mg/dL (ref 65–99)
Potassium: 4.1 mmol/L (ref 3.5–5.3)
Sodium: 140 mmol/L (ref 135–146)
Total Bilirubin: 0.3 mg/dL (ref 0.2–1.2)
Total Protein: 6.9 g/dL (ref 6.1–8.1)
eGFR: 111 mL/min/{1.73_m2} (ref >=60)

## 2024-03-31 LAB — LIPID PANEL
Cholesterol: 165 mg/dL (ref <200)
HDL: 40 mg/dL — ABNORMAL LOW (ref >=50)
LDL Cholesterol (Calc): 97 mg/dL
Non-HDL Cholesterol (Calc): 125 mg/dL (ref <130)
Total CHOL/HDL Ratio: 4.1 (calc) (ref <5.0)
Triglycerides: 185 mg/dL — ABNORMAL HIGH (ref <150)

## 2024-03-31 LAB — HEPATITIS C ANTIBODY: Hepatitis C Ab: NONREACTIVE

## 2024-03-31 LAB — HEMOGLOBIN A1C
Hgb A1c MFr Bld: 7.2 % — ABNORMAL HIGH (ref <5.7)
Mean Plasma Glucose: 160 mg/dL
eAG (mmol/L): 8.9 mmol/L

## 2024-04-08 ENCOUNTER — Other Ambulatory Visit: Payer: Self-pay | Admitting: Internal Medicine

## 2024-04-08 DIAGNOSIS — E1165 Type 2 diabetes mellitus with hyperglycemia: Secondary | ICD-10-CM

## 2024-04-08 MED ORDER — METFORMIN HCL 500 MG PO TABS: 500.0000 mg | ORAL_TABLET | Freq: Every day | ORAL | 1 refills | Status: DC

## 2024-04-13 ENCOUNTER — Encounter: Payer: Self-pay | Admitting: Internal Medicine

## 2024-06-07 ENCOUNTER — Ambulatory Visit
Admission: RE | Admit: 2024-06-07 | Discharge: 2024-06-07 | Disposition: A | Discharge status: Home / Self Care | Source: Ambulatory Visit | Attending: Internal Medicine | Admitting: Internal Medicine

## 2024-06-07 DIAGNOSIS — Z1231 Encounter for screening mammogram for malignant neoplasm of breast: Secondary | ICD-10-CM | POA: Diagnosis not present

## 2024-06-25 LAB — HM DIABETES EYE EXAM

## 2024-07-02 ENCOUNTER — Other Ambulatory Visit: Payer: Self-pay | Admitting: Medical Genetics

## 2024-07-05 ENCOUNTER — Other Ambulatory Visit
Admission: RE | Admit: 2024-07-05 | Discharge: 2024-07-05 | Disposition: A | Discharge status: Home / Self Care | Payer: Self-pay | Source: Ambulatory Visit | Attending: Medical Genetics | Admitting: Medical Genetics

## 2024-07-12 ENCOUNTER — Other Ambulatory Visit: Payer: Self-pay | Admitting: Internal Medicine

## 2024-07-12 DIAGNOSIS — K219 Gastro-esophageal reflux disease without esophagitis: Secondary | ICD-10-CM

## 2024-07-13 LAB — GENECONNECT MOLECULAR SCREEN: Genetic Analysis Overall Interpretation: NEGATIVE

## 2024-07-13 NOTE — Telephone Encounter (Signed)
 Requested Prescriptions  Pending Prescriptions Disp Refills   omeprazole  (PRILOSEC) 40 MG capsule [Pharmacy Med Name: Omeprazole  40 MG Oral Capsule Delayed Release] 30 capsule 0    Sig: Take 1 capsule by mouth once daily     Gastroenterology: Proton Pump Inhibitors Passed - 07/13/2024 12:56 PM      Passed - Valid encounter within last 12 months    Recent Outpatient Visits           3 months ago Type 2 diabetes mellitus with hyperglycemia, without long-term current use of insulin [facility])   [facility] Bernardo Fend, [place]

## 2024-08-02 ENCOUNTER — Encounter: Payer: Self-pay | Admitting: Internal Medicine

## 2024-08-04 ENCOUNTER — Ambulatory Visit: Admitting: Internal Medicine

## 2024-08-04 ENCOUNTER — Encounter: Payer: Self-pay | Admitting: Internal Medicine

## 2024-08-04 ENCOUNTER — Other Ambulatory Visit: Payer: Self-pay

## 2024-08-04 VITALS — BP 120/72 | HR 82 | Temp 98.2°F | Resp 16 | Ht 62.0 in | Wt 198.9 lb

## 2024-08-04 DIAGNOSIS — R062 Wheezing: Secondary | ICD-10-CM

## 2024-08-04 DIAGNOSIS — R052 Subacute cough: Secondary | ICD-10-CM | POA: Diagnosis not present

## 2024-08-04 MED ORDER — ALBUTEROL SULFATE HFA 108 (90 BASE) MCG/ACT IN AERS: 2.0000 | INHALATION_SPRAY | Freq: Four times a day (QID) | RESPIRATORY_TRACT | 0 refills | Status: AC | PRN

## 2024-08-04 NOTE — Progress Notes (Signed)
 Acute Office Visit  Subjective:     Patient ID: Kristen [name], female    DOB: [birth year], 43 y.o.   MRN: [id number]  Chief Complaint  Patient presents with   Cough    At night only     Cough Associated symptoms include shortness of breath and wheezing. Pertinent negatives include no chills or fever.   Patient is in today for cough.   Discussed the use of AI scribe software for clinical note transcription with the patient, who gave verbal consent to proceed.  History of Present Illness Kristen [name] is a 43 year old female who presents with a chronic dry cough and shortness of breath.  She has experienced a dry cough for over a month, worsening at night, with a dry, scratchy throat. Shortness of breath occurs, particularly at rest, and the cough is non-productive. Symptoms worsen after waking, with a completely dry mouth and difficulty stopping the cough or catching her breath. Occasional wheezing and shortness of breath at rest are noted.  Her husband suspects sleep apnea, possibly related to weight gain, but she has not been evaluated for this. She has not undergone testing for sleep apnea.  Her medication regimen includes over-the-counter omeprazole  and daily cetirizine. She previously used famotidine and methotrexate, with symptoms starting after discontinuing methotrexate. No recent changes in medications.  She has not had any chest x-rays or pulmonary function tests since symptom onset. No history of chronic respiratory illnesses such as asthma.   Review of Systems  Constitutional:  Negative for chills and fever.  Respiratory:  Positive for cough, shortness of breath and wheezing. Negative for sputum production.         Objective:    BP 120/72 (Cuff Size: Large)   Pulse 82   Temp 98.2 F (36.8 C) (Oral)   Resp 16   Ht 5' 2 (1.575 m)   Wt 198 lb 14.4 oz (90.2 kg)   SpO2 97%   BMI 36.38 kg/m    Physical Exam Constitutional:      Appearance: Normal  appearance.  HENT:     Head: Normocephalic and atraumatic.     Mouth/Throat:     Mouth: Mucous membranes are moist.     Comments: Mild PND Eyes:     Conjunctiva/sclera: Conjunctivae normal.  Cardiovascular:     Rate and Rhythm: Normal rate and regular rhythm.  Pulmonary:     Effort: Pulmonary effort is normal.     Breath sounds: Normal breath sounds. No wheezing, rhonchi or rales.  Skin:    General: Skin is warm and dry.  Neurological:     General: No focal deficit present.     Mental Status: She is alert. Mental status is at baseline.  Psychiatric:        Mood and Affect: Mood normal.        Behavior: Behavior normal.     No results found for any visits on 08/04/24.      Assessment & Plan:   Assessment and Plan Assessment & Plan Chronic dry cough with shortness of breath and wheezing Chronic dry cough with nocturnal exacerbation, shortness of breath, and occasional wheezing. Differential includes asthma, COPD, and sleep apnea. Lungs clear, ruling out pneumonia. Considering upper respiratory causes. - Prescribed albuterol  inhaler every 4-6 hours as needed for shortness of breath or wheezing. - Consider pulmonary function test if no improvement. - Evaluate for sleep apnea if symptoms persist.  Gastroesophageal reflux disease (GERD) GERD likely contributing to nocturnal  cough due to supine acid reflux. Current omeprazole  dose insufficient. - Increase omeprazole  to 40 mg daily, taking two 20 mg tablets, one in the morning and one at night.  Allergic rhinitis with sinus drainage Sinus drainage contributing to cough. Mild sinus drainage observed. Current cetirizine use noted. - Continue cetirizine. - Use nasal saline spray twice daily. - Use Flonase nasal spray, two sprays in each nostril twice daily after saline spray.  - albuterol  (VENTOLIN  HFA) 108 (90 Base) MCG/ACT inhaler; Inhale 2 puffs into the lungs every 6 (six) hours as needed for wheezing or shortness of breath.   Dispense: 8 g; Refill: 0   Return for already scheduled.  Sharyle Fischer, DO

## 2024-08-04 NOTE — Patient Instructions (Addendum)
 It was great seeing you today!  Plan discussed at today's visit: -Recommend increasing Prilosec to 20 mg twice a day -Also recommend nasal saline spray twice a day followed by nasal steroid (Flonase) 2 sprays on each side twice a day -Use Albuterol  inhaler every 4-6 hours as needed for shortness of breath/wheezing   Follow up in: November (already scheduled)  Take care and let us  know if you have any questions or concerns prior to your next visit.  Dr. Bernardo

## 2024-09-24 ENCOUNTER — Other Ambulatory Visit: Payer: Self-pay | Admitting: Internal Medicine

## 2024-09-24 DIAGNOSIS — F32A Depression, unspecified: Secondary | ICD-10-CM

## 2024-09-24 DIAGNOSIS — E782 Mixed hyperlipidemia: Secondary | ICD-10-CM

## 2024-09-25 NOTE — Telephone Encounter (Signed)
 Requested Prescriptions  Pending Prescriptions Disp Refills   atorvastatin  (LIPITOR) 20 MG tablet [Pharmacy Med Name: Atorvastatin  Calcium  20 MG Oral Tablet] 90 tablet 2    Sig: Take 1 tablet by mouth once daily     Cardiovascular:  Antilipid - Statins Failed - 09/25/2024  8:07 AM      Failed - Lipid Panel in normal range within the last 12 months    Cholesterol  Date Value Ref Range Status  03/30/2024 165 <200 mg/dL Final   LDL Cholesterol (Calc)  Date Value Ref Range Status  03/30/2024 97 mg/dL (calc) Final    Comment:    Reference range: <100 . Desirable range <100 mg/dL for primary prevention;   <70 mg/dL for patients with CHD or diabetic patients  with > or = 2 CHD risk factors. SABRA LDL-C is now calculated using the Martin-Hopkins  calculation, which is a validated novel method providing  better accuracy than the Friedewald equation in the  estimation of LDL-C.  Gladis APPLETHWAITE et al. SANDREA. 7986;689(80): 2061-2068  (http://education.[employer].com/faq/FAQ164)    Direct LDL  Date Value Ref Range Status  06/18/2022 166.0 mg/dL Final    Comment:    Optimal:  <100 mg/dLNear or Above Optimal:  100-129 mg/dLBorderline High:  130-159 mg/dLHigh:  160-189 mg/dLVery High:  >190 mg/dL   HDL  Date Value Ref Range Status  03/30/2024 40 (L) > OR = 50 mg/dL Final   Triglycerides  Date Value Ref Range Status  03/30/2024 185 (H) <150 mg/dL Final         Passed - Patient is not pregnant      Passed - Valid encounter within last 12 months    Recent Outpatient Visits           1 month ago Wheezing   [place] [facility] Bernardo Fend, [place]   5 months ago Type 2 diabetes mellitus with hyperglycemia, without long-term current use of insulin [facility])   [place] [facility] Bernardo Fend, DO               escitalopram  (LEXAPRO ) 10 MG tablet [Pharmacy Med Name: Escitalopram  Oxalate 10 MG Oral Tablet] 90 tablet 0    Sig: Take 1 tablet  by mouth once daily     Psychiatry:  Antidepressants - SSRI Passed - 09/25/2024  8:07 AM      Passed - Completed PHQ-2 or PHQ-9 in the last 360 days      Passed - Valid encounter within last 6 months    Recent Outpatient Visits           1 month ago Wheezing   [facility] Bernardo Fend, DO   5 months ago Type 2 diabetes mellitus with hyperglycemia, without long-term current use of insulin [facility])   [facility] Bernardo Fend, [place]

## 2024-09-30 ENCOUNTER — Ambulatory Visit: Admitting: Internal Medicine

## 2024-09-30 ENCOUNTER — Encounter: Payer: Self-pay | Admitting: Internal Medicine

## 2024-09-30 ENCOUNTER — Other Ambulatory Visit: Payer: Self-pay

## 2024-09-30 VITALS — BP 110/72 | HR 100 | Temp 98.3°F | Resp 16 | Ht 62.0 in | Wt 200.9 lb

## 2024-09-30 DIAGNOSIS — E1165 Type 2 diabetes mellitus with hyperglycemia: Secondary | ICD-10-CM

## 2024-09-30 DIAGNOSIS — Z6836 Body mass index (BMI) 36.0-36.9, adult: Secondary | ICD-10-CM | POA: Diagnosis not present

## 2024-09-30 DIAGNOSIS — F32A Depression, unspecified: Secondary | ICD-10-CM

## 2024-09-30 DIAGNOSIS — Z23 Encounter for immunization: Secondary | ICD-10-CM

## 2024-09-30 DIAGNOSIS — Z7984 Long term (current) use of oral hypoglycemic drugs: Secondary | ICD-10-CM | POA: Diagnosis not present

## 2024-09-30 DIAGNOSIS — F419 Anxiety disorder, unspecified: Secondary | ICD-10-CM

## 2024-09-30 DIAGNOSIS — K219 Gastro-esophageal reflux disease without esophagitis: Secondary | ICD-10-CM

## 2024-09-30 DIAGNOSIS — E66812 Obesity, class 2: Secondary | ICD-10-CM | POA: Diagnosis not present

## 2024-09-30 LAB — POCT GLYCOSYLATED HEMOGLOBIN (HGB A1C): Hemoglobin A1C: 5.3 % (ref 4.0–5.6)

## 2024-09-30 MED ORDER — METFORMIN HCL 500 MG PO TABS
500.0000 mg | ORAL_TABLET | Freq: Every day | ORAL | 1 refills | Status: AC
Start: 2024-09-30 — End: ?

## 2024-09-30 MED ORDER — TRULICITY 0.75 MG/0.5ML ~~LOC~~ SOAJ: 0.7500 mg | SUBCUTANEOUS | 0 refills | Status: DC

## 2024-09-30 MED ORDER — ESCITALOPRAM OXALATE 10 MG PO TABS: 10.0000 mg | ORAL_TABLET | Freq: Every day | ORAL | 1 refills | Status: AC

## 2024-09-30 NOTE — Progress Notes (Signed)
 Established Patient Office Visit  Subjective    Patient ID: Kristen [name], female    DOB: 08/19/1981  Age: 43 y.o. MRN: [id number]  CC:  Chief Complaint  Patient presents with   Medical Management of Chronic Issues    5 month recheck    HPI Kristen [name] presents to follow up on chronic medical conditions.   Discussed the use of AI scribe software for clinical note transcription with the patient, who gave verbal consent to proceed.  History of Present Illness  Kristen [name] is a 43 year old female with diabetes who presents for a diabetic follow-up.  She feels slightly tired but reports no significant changes in her condition. Her last hemoglobin A1c was 7.2 in May, and a finger stick was performed today to check her current glucose levels. There have been no changes in her diet or weight since her last visit. She denies any foot problems such as numbness, tingling, or wounds. She is currently taking metformin  once daily for diabetes management.  She takes over-the-counter omeprazole  for acid reflux, Lexapro  10 mg for mood stabilization, and Lipitor for cholesterol management. She received a flu vaccine today and discussed the pneumonia vaccine due to her diabetes. She expressed some concern about injections but was open to learning more about them.   Diabetes, Type 2: -Last A1c 6.4% 10/24 -Medications: Metformin  500 mg once daily -Patient is compliant with the above medications and reports no side effects.  -Diet: tried to reduce drinking sodas  -Eye exam: UTD 8/24  -Foot exam: UTD 10/24 -Microalbumin: UTD 10/24 -Statin: yes -PNA vaccine: no - discussed, patient will think about it -Denies symptoms of hypoglycemia, polyuria, polydipsia, numbness extremities, foot ulcers/trauma.   HLD: -Medications: Lipitor 20 mg -Patient is compliant with medications and denies side effects  -Last lipid panel: Lipid Panel     Component Value Date/Time   CHOL 165 03/30/2024 1421    TRIG 185 (H) 03/30/2024 1421   HDL 40 (L) 03/30/2024 1421   CHOLHDL 4.1 03/30/2024 1421   VLDL 32.6 01/06/2023 0825   LDLCALC 97 03/30/2024 1421   LDLDIRECT 166.0 06/18/2022 1131   GERD: -Currently on Prilosec 20 mg  MDD: -Mood status: stable -Current treatment: Lexapro  10 mg -Satisfied with current treatment?: yes -Duration of current treatment : years -Side effects: no Medication compliance: excellent compliance     09/30/2024    2:11 PM 03/30/2024    1:41 PM 03/17/2023    1:16 PM 03/17/2023    1:13 PM 12/20/2022    3:28 PM  Depression screen PHQ 2/9  Decreased Interest 0 1 2 2 1   Down, Depressed, Hopeless 0 1 1 1  0  PHQ - 2 Score 0 2 3 3 1   Altered sleeping  1 1 0 1  Tired, decreased energy  1 3 0 2  Change in appetite  1 2 0 1  Feeling bad or failure about yourself   1 0 0 0  Trouble concentrating  1 1 0 1  Moving slowly or fidgety/restless  1 1 0 0  Suicidal thoughts  0 0 0 0  PHQ-9 Score  8  11  3  6    Difficult doing work/chores  Somewhat difficult Somewhat difficult Not difficult at all Not difficult at all     Data saved with a previous flowsheet row definition   Health Maintenance: -Blood work due -Mammogram 7/24 Birads-1 -History of partial hysterectomy - no Paps, see Gynecology   Outpatient Encounter  Medications as of 09/30/2024  Medication Sig   albuterol  (VENTOLIN  HFA) 108 (90 Base) MCG/ACT inhaler Inhale 2 puffs into the lungs every 6 (six) hours as needed for wheezing or shortness of breath.   atorvastatin  (LIPITOR) 20 MG tablet Take 1 tablet by mouth once daily   clobetasol  ointment (TEMOVATE ) 0.05 % Apply to affected area every night for 4 weeks, then every other day for 4 weeks and then twice a week for 4 weeks or until resolution.   escitalopram  (LEXAPRO ) 10 MG tablet Take 1 tablet by mouth once daily   metFORMIN  (GLUCOPHAGE ) 500 MG tablet Take 1 tablet (500 mg total) by mouth daily with breakfast.   Multiple Vitamin (MULTIVITAMIN) tablet Take 1  tablet by mouth daily.   omeprazole  (PRILOSEC) 40 MG capsule Take 1 capsule by mouth once daily   No facility-administered encounter medications on file as of 09/30/2024.    Past Medical History:  Diagnosis Date   Allergy    seasonal time allergies   Anxiety    sometimes have anxiety over new/changing things   Depression    Diabetes mellitus without complication (HCC)    Endometriosis    Fainting spell    Frequent headaches    GERD (gastroesophageal reflux disease)    Hyperlipidemia    Lichen sclerosus 01/07/2023   Seizures (HCC)    have experience seizures in the past   Syncope     Past Surgical History:  Procedure Laterality Date   ABDOMINAL HYSTERECTOMY     CYST EXCISION  09/2022   EXPLORATORY LAPAROTOMY     TOTAL LAPAROSCOPIC HYSTERECTOMY WITH SALPINGECTOMY Bilateral 12/31/2013   Done at [facility] for pelvic pain, benign pathology and no endometriosis seen    Family History  Problem Relation Age of Onset   Cancer Mother        Thyroid    Hyperlipidemia Mother    Hypertension Mother    Diabetes Mother    Depression Mother    Breast cancer Maternal Grandmother    Cancer Maternal Grandmother        Breast, kidney   Mental illness Maternal Grandmother    Heart disease Paternal Grandfather     Social History   Socioeconomic History   Marital status: Married    Spouse name: Not on file   Number of children: 2   Years of education: Not on file   Highest education level: Associate degree: academic program  Occupational History   Not on file  Tobacco Use   Smoking status: Former    Current packs/day: 0.00    Average packs/day: 0.3 packs/day for 3.0 years (0.8 ttl pk-yrs)    Types: Cigarettes    Start date: 67    Quit date: 1999    Years since quitting: 26.8    Passive exposure: Past   Smokeless tobacco: Never   Tobacco comments:    My smoking was many years ago  Vaping Use   Vaping status: Never Used  Substance and Sexual Activity   Alcohol use: Not  Currently    Alcohol/week: 2.0 standard drinks of alcohol    Types: 1 Shots of liquor, 1 Standard drinks or equivalent per week    Comment: Very little   Drug use: No   Sexual activity: Yes    Birth control/protection: Other-see comments    Comment: Only have ovaries left, everything else was removed  Other Topics Concern   Not on file  Social History Narrative       Social Drivers  of Health   Financial Resource Strain: Low Risk  (09/26/2024)   Overall Financial Resource Strain (CARDIA)    Difficulty of Paying Living Expenses: Not very hard  Recent Concern: Financial Resource Strain - Medium Risk (08/03/2024)   Overall Financial Resource Strain (CARDIA)    Difficulty of Paying Living Expenses: Somewhat hard  Food Insecurity: Food Insecurity Present (09/26/2024)   Hunger Vital Sign    Worried About Running Out of Food in the Last Year: Sometimes true    Ran Out of Food in the Last Year: Sometimes true  Transportation Needs: No Transportation Needs (09/26/2024)   PRAPARE - Administrator, Civil Service (Medical): No    Lack of Transportation (Non-Medical): No  Physical Activity: Inactive (09/26/2024)   Exercise Vital Sign    Days of Exercise per Week: 0 days    Minutes of Exercise per Session: Not on file  Stress: Stress Concern Present (09/26/2024)   [employer] of Occupational Health - Occupational Stress Questionnaire    Feeling of Stress: To some extent  Social Connections: Socially Isolated (09/26/2024)   Social Connection and Isolation Panel    Frequency of Communication with Friends and Family: Never    Frequency of Social Gatherings with Friends and Family: Never    Attends Religious Services: Never    Database Administrator or Organizations: No    Attends Engineer, Structural: Not on file    Marital Status: Married  Catering Manager Violence: Not on file    Review of Systems  All other systems reviewed and are negative.       Objective     BP 110/72 (Cuff Size: Large)   Pulse 100   Temp 98.3 F (36.8 C) (Oral)   Resp 16   Ht 5' 2 (1.575 m)   Wt 200 lb 14.4 oz (91.1 kg)   SpO2 95%   BMI 36.75 kg/m   Physical Exam Constitutional:      Appearance: Normal appearance. She is obese.  HENT:     Head: Normocephalic and atraumatic.  Eyes:     Conjunctiva/sclera: Conjunctivae normal.  Cardiovascular:     Rate and Rhythm: Normal rate and regular rhythm.     Pulses:          Dorsalis pedis pulses are 2+ on the right side and 2+ on the left side.  Pulmonary:     Effort: Pulmonary effort is normal.     Breath sounds: Normal breath sounds.  Musculoskeletal:     Right foot: Normal range of motion. No deformity, bunion, Charcot foot, foot drop or prominent metatarsal heads.     Left foot: Normal range of motion. No deformity, bunion, Charcot foot, foot drop or prominent metatarsal heads.  Feet:     Right foot:     Protective Sensation: 6 sites tested.  6 sites sensed.     Skin integrity: Skin integrity normal.     Toenail Condition: Right toenails are normal.     Left foot:     Protective Sensation: 6 sites tested.  6 sites sensed.     Skin integrity: Skin integrity normal.     Toenail Condition: Left toenails are normal.  Skin:    General: Skin is warm and dry.  Neurological:     General: No focal deficit present.     Mental Status: She is alert. Mental status is at baseline.  Psychiatric:        Mood and Affect: Mood normal.  Behavior: Behavior normal.         Assessment & Plan:   Assessment & Plan  Type 2 diabetes mellitus Well-controlled with A1c of 5.3. Discussed GLP-1 receptor agonists for weight loss and glycemic control. Trulicity preferred by insurance. Explained risks of pancreatitis, acid reflux, and constipation. Emphasized fiber supplementation. - Refilled metformin . - Initiated Trulicity at 0.75 mg weekly. - Scheduled follow-up in one month to assess response to Trulicity.  Obesity  and weight management Discussed GLP-1 receptor agonists for weight loss. Trulicity preferred by insurance. Explained potential side effects and benefits. - Initiated Trulicity at 0.75 mg weekly. - Scheduled follow-up in one month to assess weight loss and response to Trulicity.  Depression Well-managed on Lexapro  10 mg. - Refilled Lexapro  with refills.  Gastroesophageal reflux disease (GERD) Managed with over-the-counter omeprazole . Prescription not covered by insurance. - Continue over-the-counter omeprazole  as needed.  General Health Maintenance Discussed pneumonia and flu vaccines. Administered flu vaccine today. - Administered pneumonia vaccine. - Continue annual flu vaccination.  - Flu vaccine trivalent PF, 6mos and older(Flulaval,Afluria,Fluarix,Fluzone) - POCT HgB A1C - HM Diabetes Foot Exam - Urine Microalbumin w/creat. ratio - metFORMIN  (GLUCOPHAGE ) 500 MG tablet; Take 1 tablet (500 mg total) by mouth daily with breakfast.  Dispense: 90 tablet; Refill: 1 - Dulaglutide (TRULICITY) 0.75 MG/0.5ML SOAJ; Inject 0.75 mg into the skin once a week.  Dispense: 3 mL; Refill: 0 - escitalopram  (LEXAPRO ) 10 MG tablet; Take 1 tablet (10 mg total) by mouth daily.  Dispense: 90 tablet; Refill: 1 - Pneumococcal conjugate vaccine 20-valent (Prevnar 20)   Return in about 4 weeks (around 10/28/2024).   Sharyle Fischer, DO

## 2024-10-01 LAB — MICROALBUMIN / CREATININE URINE RATIO
Creatinine, Urine: 146 mg/dL (ref 20–275)
Microalb Creat Ratio: 5 mg/g{creat} (ref <30)
Microalb, Ur: 0.7 mg/dL

## 2024-10-04 ENCOUNTER — Ambulatory Visit: Payer: Self-pay | Admitting: Internal Medicine

## 2024-10-18 ENCOUNTER — Other Ambulatory Visit: Payer: Self-pay | Admitting: Internal Medicine

## 2024-10-18 DIAGNOSIS — E66812 Obesity, class 2: Secondary | ICD-10-CM

## 2024-10-18 DIAGNOSIS — E1165 Type 2 diabetes mellitus with hyperglycemia: Secondary | ICD-10-CM

## 2024-10-21 NOTE — Telephone Encounter (Signed)
 Requested medication (s) are due for refill today: yes  Requested medication (s) are on the active medication list: yes  Last refill:  10/02/24  Future visit scheduled: yes  Notes to clinic:  routing for review of dose.     Requested Prescriptions  Pending Prescriptions Disp Refills   TRULICITY  0.75 MG/0.5ML SOAJ [Pharmacy Med Name: Trulicity  0.75 MG/0.5ML Subcutaneous Solution Auto-injector] 4 mL 0    Sig: INJECT 1  SUBCUTANEOUSLY ONCE A WEEK     Endocrinology:  Diabetes - GLP-1 Receptor Agonists Passed - 10/21/2024 11:02 AM      Passed - HBA1C is between 0 and 7.9 and within 180 days    Hemoglobin A1C  Date Value Ref Range Status  09/30/2024 5.3 4.0 - 5.6 % Final   Hgb A1c MFr Bld  Date Value Ref Range Status  03/30/2024 7.2 (H) <5.7 % Final    Comment:    For someone without known diabetes, a hemoglobin A1c value of 6.5% or greater indicates that they may have  diabetes and this should be confirmed with a follow-up  test. . For someone with known diabetes, a value <7% indicates  that their diabetes is well controlled and a value  greater than or equal to 7% indicates suboptimal  control. A1c targets should be individualized based on  duration of diabetes, age, comorbid conditions, and  other considerations. . Currently, no consensus exists regarding use of hemoglobin A1c for diagnosis of diabetes for children. SABRA Amy - Valid encounter within last 6 months    Recent Outpatient Visits           3 weeks ago Type 2 diabetes mellitus with hyperglycemia, without long-term current use of insulin [facility])   [place] [facility] Bernardo Fend, DO   2 months ago Wheezing   [facility] Bernardo Fend, DO   6 months ago Type 2 diabetes mellitus with hyperglycemia, without long-term current use of insulin [facility])   [facility] Bernardo Fend, [place]

## 2024-11-02 ENCOUNTER — Ambulatory Visit: Admitting: Internal Medicine

## 2024-11-02 ENCOUNTER — Encounter: Payer: Self-pay | Admitting: Internal Medicine

## 2024-11-02 ENCOUNTER — Other Ambulatory Visit: Payer: Self-pay

## 2024-11-02 VITALS — BP 130/72 | HR 97 | Temp 98.1°F | Resp 16 | Ht 62.0 in | Wt 197.8 lb

## 2024-11-02 DIAGNOSIS — Z6836 Body mass index (BMI) 36.0-36.9, adult: Secondary | ICD-10-CM

## 2024-11-02 DIAGNOSIS — Z7984 Long term (current) use of oral hypoglycemic drugs: Secondary | ICD-10-CM

## 2024-11-02 DIAGNOSIS — J069 Acute upper respiratory infection, unspecified: Secondary | ICD-10-CM

## 2024-11-02 DIAGNOSIS — E66812 Obesity, class 2: Secondary | ICD-10-CM

## 2024-11-02 DIAGNOSIS — E1165 Type 2 diabetes mellitus with hyperglycemia: Secondary | ICD-10-CM

## 2024-11-02 DIAGNOSIS — H6993 Unspecified Eustachian tube disorder, bilateral: Secondary | ICD-10-CM

## 2024-11-02 MED ORDER — TRULICITY 1.5 MG/0.5ML ~~LOC~~ SOAJ: 1.5000 mg | SUBCUTANEOUS | 0 refills | Status: AC

## 2024-11-02 NOTE — Progress Notes (Signed)
 Established Patient Office Visit  Subjective    Patient ID: Kristen [name], female    DOB: 03-Jan-1981  Age: 43 y.o. MRN: [id number]  CC:  Chief Complaint  Patient presents with   Diabetes    4 week recheck    HPI Kristen [name] presents to follow up on chronic medical conditions.   Discussed the use of AI scribe software for clinical note transcription with the patient, who gave verbal consent to proceed.  History of Present Illness  Kristen [name] is a 43 year old female who presents for follow-up regarding Trulicity  use and upper respiratory symptoms.  She uses Trulicity  for weight management and now needs her husband's help to administer injections. She had one episode of bruising after an injection given while she was standing. She denies nausea or acid reflux but has occasional constipation. Her weight decreased from 200 to 197 pounds since her last visit.  Since Friday she has had congestion, stuffy nose, ear pressure, facial pressure, and a sometimes productive cough. She had a fever on Friday that has not recurred. She is taking Tylenol , ibuprofen, generic Sudafed, and a nasal spray for relief. Her pelvic floor weakness causes frequent clothing changes when she coughs.   Diabetes, Type 2: -Last A1c 5.3% 11/25 -Medications: Metformin  500 mg once daily, Trulicity  0.75 mg added at LOV for weight, lost about 3 pounds -Patient is compliant with the above medications and reports no side effects.  -Diet: tried to reduce drinking sodas  -Eye exam: UTD  -Foot exam: UTD  -Microalbumin: UTD  -Statin: yes -PNA vaccine: UTD -Denies symptoms of hypoglycemia, polyuria, polydipsia, numbness extremities, foot ulcers/trauma.   HLD: -Medications: Lipitor 20 mg -Patient is compliant with medications and denies side effects  -Last lipid panel: Lipid Panel     Component Value Date/Time   CHOL 165 03/30/2024 1421   TRIG 185 (H) 03/30/2024 1421   HDL 40 (L) 03/30/2024 1421   CHOLHDL  4.1 03/30/2024 1421   VLDL 32.6 01/06/2023 0825   LDLCALC 97 03/30/2024 1421   LDLDIRECT 166.0 06/18/2022 1131   GERD: -Currently on Prilosec 20 mg  MDD: -Mood status: stable -Current treatment: Lexapro  10 mg -Satisfied with current treatment?: yes -Duration of current treatment : years -Side effects: no Medication compliance: excellent compliance     09/30/2024    2:11 PM 03/30/2024    1:41 PM 03/17/2023    1:16 PM 03/17/2023    1:13 PM 12/20/2022    3:28 PM  Depression screen PHQ 2/9  Decreased Interest 0 1 2 2 1   Down, Depressed, Hopeless 0 1 1 1  0  PHQ - 2 Score 0 2 3 3 1   Altered sleeping  1 1 0 1  Tired, decreased energy  1 3 0 2  Change in appetite  1 2 0 1  Feeling bad or failure about yourself   1 0 0 0  Trouble concentrating  1 1 0 1  Moving slowly or fidgety/restless  1 1 0 0  Suicidal thoughts  0 0 0 0  PHQ-9 Score  8  11  3  6    Difficult doing work/chores  Somewhat difficult Somewhat difficult Not difficult at [name] Not difficult at [name]     Data saved with a previous flowsheet row definition   Health Maintenance: -Blood work UTD -Mammogram 7/25 Birads-1 -History of partial hysterectomy - no Paps, see Gynecology   Outpatient Encounter Medications as of 11/02/2024  Medication Sig   albuterol  (  VENTOLIN  HFA) 108 (90 Base) MCG/ACT inhaler Inhale 2 puffs into the lungs every 6 (six) hours as needed for wheezing or shortness of breath.   atorvastatin  (LIPITOR) 20 MG tablet Take 1 tablet by mouth once daily   clobetasol  ointment (TEMOVATE ) 0.05 % Apply to affected area every night for 4 weeks, then every other day for 4 weeks and then twice a week for 4 weeks or until resolution.   Dulaglutide  (TRULICITY ) 0.75 MG/0.5ML SOAJ INJECT 1  SUBCUTANEOUSLY ONCE A WEEK   escitalopram  (LEXAPRO ) 10 MG tablet Take 1 tablet (10 mg total) by mouth daily.   metFORMIN  (GLUCOPHAGE ) 500 MG tablet Take 1 tablet (500 mg total) by mouth daily with breakfast.   Multiple Vitamin  (MULTIVITAMIN) tablet Take 1 tablet by mouth daily.   omeprazole  (PRILOSEC) 40 MG capsule Take 1 capsule by mouth once daily   No facility-administered encounter medications on file as of 11/02/2024.    Past Medical History:  Diagnosis Date   Allergy    seasonal time allergies   Anxiety    sometimes have anxiety over new/changing things   Depression    Diabetes mellitus without complication (HCC)    Endometriosis    Fainting spell    Frequent headaches    GERD (gastroesophageal reflux disease)    Hyperlipidemia    Lichen sclerosus 01/07/2023   Seizures (HCC)    have experience seizures in the past   Syncope     Past Surgical History:  Procedure Laterality Date   ABDOMINAL HYSTERECTOMY     CYST EXCISION  09/2022   EXPLORATORY LAPAROTOMY     TOTAL LAPAROSCOPIC HYSTERECTOMY WITH SALPINGECTOMY Bilateral 12/31/2013   Done at [facility] for pelvic pain, benign pathology and no endometriosis seen    Family History  Problem Relation Age of Onset   Cancer Mother        Thyroid    Hyperlipidemia Mother    Hypertension Mother    Diabetes Mother    Depression Mother    Breast cancer Maternal Grandmother    Cancer Maternal Grandmother        Breast, kidney   Mental illness Maternal Grandmother    Heart disease Paternal Grandfather     Social History   Socioeconomic History   Marital status: Married    Spouse name: Not on file   Number of children: 2   Years of education: Not on file   Highest education level: Associate degree: academic program  Occupational History   Not on file  Tobacco Use   Smoking status: Former    Current packs/day: 0.00    Average packs/day: 0.3 packs/day for 3.0 years (0.8 ttl pk-yrs)    Types: Cigarettes    Start date: 38    Quit date: 1999    Years since quitting: 26.9    Passive exposure: Past   Smokeless tobacco: Never   Tobacco comments:    My smoking was many years ago  Vaping Use   Vaping status: Never Used  Substance and Sexual  Activity   Alcohol use: Not Currently    Alcohol/week: 2.0 standard drinks of alcohol    Types: 1 Shots of liquor, 1 Standard drinks or equivalent per week    Comment: Very little   Drug use: No   Sexual activity: Yes    Birth control/protection: Other-see comments    Comment: Only have ovaries left, everything else was removed  Other Topics Concern   Not on file  Social History Narrative  Social Drivers of Health   Tobacco Use: Medium Risk (11/02/2024)   Patient History    Smoking Tobacco Use: Former    Smokeless Tobacco Use: Never    Passive Exposure: Past  Physicist, Medical Strain: Low Risk (09/26/2024)   Overall Financial Resource Strain (CARDIA)    Difficulty of Paying Living Expenses: Not very hard  Recent Concern: Financial Resource Strain - Medium Risk (08/03/2024)   Overall Financial Resource Strain (CARDIA)    Difficulty of Paying Living Expenses: Somewhat hard  Food Insecurity: Food Insecurity Present (09/26/2024)   Epic    Worried About Programme Researcher, Broadcasting/film/video in the Last Year: Sometimes true    Ran Out of Food in the Last Year: Sometimes true  Transportation Needs: No Transportation Needs (09/26/2024)   Epic    Lack of Transportation (Medical): No    Lack of Transportation (Non-Medical): No  Physical Activity: Inactive (09/26/2024)   Exercise Vital Sign    Days of Exercise per Week: 0 days    Minutes of Exercise per Session: Not on file  Stress: Stress Concern Present (09/26/2024)   [employer] of Occupational Health - Occupational Stress Questionnaire    Feeling of Stress: To some extent  Social Connections: Socially Isolated (09/26/2024)   Social Connection and Isolation Panel    Frequency of Communication with Friends and Family: Never    Frequency of Social Gatherings with Friends and Family: Never    Attends Religious Services: Never    Database Administrator or Organizations: No    Attends Engineer, Structural: Not on file    Marital  Status: Married  Catering Manager Violence: Not on file  Depression (PHQ2-9): Low Risk (09/30/2024)   Depression (PHQ2-9)    PHQ-2 Score: 0  Alcohol Screen: Low Risk (09/26/2024)   Alcohol Screen    Last Alcohol Screening Score (AUDIT): 0  Housing: High Risk (09/26/2024)   Epic    Unable to Pay for Housing in the Last Year: Yes    Number of Times Moved in the Last Year: 0    Homeless in the Last Year: No  Utilities: Not on file  Health Literacy: Not on file    Review of Systems  Constitutional:  Positive for fever. Negative for chills.  HENT:  Positive for congestion and ear pain. Negative for sinus pain and sore throat.   Respiratory:  Positive for cough. Negative for sputum production, shortness of breath and wheezing.   Gastrointestinal:  Negative for heartburn, nausea and vomiting.        Objective    BP 130/72 (Cuff Size: Large)   Pulse 97   Temp 98.1 F (36.7 C) (Oral)   Resp 16   Ht 5' 2 (1.575 m)   Wt 197 lb 12.8 oz (89.7 kg)   SpO2 97%   BMI 36.18 kg/m   Physical Exam Constitutional:      Appearance: Normal appearance. She is obese.  HENT:     Head: Normocephalic and atraumatic.     Right Ear: Ear canal and external ear normal.     Left Ear: Ear canal and external ear normal.     Ears:     Comments: Bilateral eustachian tube dysfunction     Nose: Congestion present.     Mouth/Throat:     Mouth: Mucous membranes are moist.     Pharynx: Oropharynx is clear.  Eyes:     Conjunctiva/sclera: Conjunctivae normal.  Cardiovascular:     Rate and Rhythm: Normal  rate and regular rhythm.  Pulmonary:     Effort: Pulmonary effort is normal.     Breath sounds: Normal breath sounds. No wheezing, rhonchi or rales.  Skin:    General: Skin is warm and dry.  Neurological:     General: No focal deficit present.     Mental Status: She is alert. Mental status is at baseline.  Psychiatric:        Mood and Affect: Mood normal.        Behavior: Behavior normal.          Assessment & Plan:   Assessment & Plan  Type 2 diabetes mellitus w/Hyperglycemia/Obesity Class 2 Managed with Trulicity , well-tolerated with weight loss from 200 lbs to 197 lbs. Current dose 0.75 mg effective without side effects. - Increased Trulicity  dose to 1.5 mg weekly. - Scheduled follow-up in two months to recheck A1c.  Acute viral upper respiratory infection Symptoms include nasal congestion, ear pain with pressure, and productive cough. No bacterial infection, likely viral due to family involvement. - Continue decongestant (Sudafed) to aid drainage. - Continue nasal spray to facilitate drainage and reduce bacterial infection risk. - Advised to call if symptoms worsen or do not improve, indicating possible bacterial infection.  - Dulaglutide  (TRULICITY ) 1.5 MG/0.5ML SOAJ; Inject 1.5 mg into the skin once a week.  Dispense: 6 mL; Refill: 0   Return in about 2 months (around 01/03/2025) for follow up on a1c.   Sharyle Fischer, DO

## 2024-11-09 ENCOUNTER — Other Ambulatory Visit (HOSPITAL_COMMUNITY): Payer: Self-pay

## 2024-11-09 ENCOUNTER — Telehealth: Payer: Self-pay | Admitting: Pharmacy Technician

## 2024-11-09 NOTE — Telephone Encounter (Signed)
 Pharmacy Patient Advocate Encounter   Received notification from Onbase that prior authorization for Trulicity  1.5 mg/0.5ml is required/requested.   Insurance verification completed.   The patient is insured through [employer].   Per test claim: Refill too soon. PA is not needed at this time. Medication was filled 10/22/24. Next eligible fill date is 11/15/24.   **I called her insurance since her dose was increased and spoke with Kyra who verified she still has to wait until 11/15/24 to fill the 1.5 mg prescription**

## 2024-11-22 ENCOUNTER — Other Ambulatory Visit (HOSPITAL_COMMUNITY): Payer: Self-pay

## 2024-11-23 ENCOUNTER — Other Ambulatory Visit (HOSPITAL_COMMUNITY): Payer: Self-pay

## 2024-11-23 ENCOUNTER — Telehealth: Payer: Self-pay | Admitting: Pharmacy Technician

## 2024-11-23 NOTE — Telephone Encounter (Signed)
 Pharmacy Patient Advocate Encounter   Received notification from Onbase [employer] that prior authorization for Trulicity  1.5MG /0.5ML auto-injectors is required/requested.   Insurance verification completed.   The patient is insured through [employer].   Per test claim: PA required; PA submitted to above mentioned insurance via Latent Key/confirmation #/[id number] Status is pending

## 2024-11-24 ENCOUNTER — Other Ambulatory Visit (HOSPITAL_COMMUNITY): Payer: Self-pay

## 2024-11-24 NOTE — Telephone Encounter (Signed)
 Please see telephone encounter 11/24/23

## 2024-11-25 ENCOUNTER — Other Ambulatory Visit (HOSPITAL_COMMUNITY): Payer: Self-pay

## 2024-11-26 ENCOUNTER — Other Ambulatory Visit (HOSPITAL_COMMUNITY): Payer: Self-pay

## 2024-11-26 NOTE — Telephone Encounter (Signed)
 Pharmacy Patient Advocate Encounter  Received notification from [facility] that Prior Authorization for Trulicity  1.5MG /0.5ML auto-injectors has been APPROVED from 11/25/2024 to 11/25/2025. Unable to obtain price due to refill too soon rejection, last fill date 11/25/2024 next available fill date01/30/2026.   PA #/Case ID/Reference #: [id number]

## 2025-01-03 ENCOUNTER — Ambulatory Visit: Admitting: Internal Medicine
